# Patient Record
Sex: Female | Born: 1970 | Race: White | Hispanic: No | Smoking: Never smoker
Health system: Southern US, Community
[De-identification: ages and names within clinical notes are randomized; demographics above are authoritative.]

## PROBLEM LIST (undated history)

## (undated) DIAGNOSIS — I82409 Acute embolism and thrombosis of unspecified deep veins of unspecified lower extremity: Secondary | ICD-10-CM

## (undated) DIAGNOSIS — I10 Essential (primary) hypertension: Secondary | ICD-10-CM

## (undated) DIAGNOSIS — R51 Headache: Secondary | ICD-10-CM

## (undated) DIAGNOSIS — I2699 Other pulmonary embolism without acute cor pulmonale: Secondary | ICD-10-CM

## (undated) DIAGNOSIS — R519 Headache, unspecified: Secondary | ICD-10-CM

## (undated) HISTORY — PX: CARPAL TUNNEL RELEASE: SHX101

## (undated) HISTORY — PX: BUNIONECTOMY: SHX129

---

## 1998-10-07 ENCOUNTER — Inpatient Hospital Stay (HOSPITAL_COMMUNITY): Admission: AD | Admit: 1998-10-07 | Discharge: 1998-10-07 | Payer: Self-pay | Admitting: Obstetrics and Gynecology

## 1998-10-19 ENCOUNTER — Inpatient Hospital Stay (HOSPITAL_COMMUNITY): Admission: AD | Admit: 1998-10-19 | Discharge: 1998-10-24 | Payer: Self-pay | Admitting: Obstetrics and Gynecology

## 1998-10-28 ENCOUNTER — Encounter (HOSPITAL_COMMUNITY): Admission: RE | Admit: 1998-10-28 | Discharge: 1999-01-26 | Payer: Self-pay | Admitting: Obstetrics and Gynecology

## 1998-12-06 ENCOUNTER — Other Ambulatory Visit: Admission: RE | Admit: 1998-12-06 | Discharge: 1998-12-06 | Payer: Self-pay | Admitting: Obstetrics and Gynecology

## 1999-09-29 ENCOUNTER — Other Ambulatory Visit: Admission: RE | Admit: 1999-09-29 | Discharge: 1999-09-29 | Payer: Self-pay | Admitting: Obstetrics and Gynecology

## 2000-03-25 ENCOUNTER — Inpatient Hospital Stay (HOSPITAL_COMMUNITY): Admission: AD | Admit: 2000-03-25 | Discharge: 2000-03-25 | Payer: Self-pay | Admitting: Obstetrics and Gynecology

## 2000-04-13 ENCOUNTER — Inpatient Hospital Stay (HOSPITAL_COMMUNITY): Admission: AD | Admit: 2000-04-13 | Discharge: 2000-04-16 | Payer: Self-pay | Admitting: *Deleted

## 2000-05-25 ENCOUNTER — Other Ambulatory Visit: Admission: RE | Admit: 2000-05-25 | Discharge: 2000-05-25 | Payer: Self-pay | Admitting: *Deleted

## 2001-05-24 ENCOUNTER — Other Ambulatory Visit: Admission: RE | Admit: 2001-05-24 | Discharge: 2001-05-24 | Payer: Self-pay | Admitting: *Deleted

## 2002-06-19 ENCOUNTER — Other Ambulatory Visit: Admission: RE | Admit: 2002-06-19 | Discharge: 2002-06-19 | Payer: Self-pay | Admitting: *Deleted

## 2003-08-10 ENCOUNTER — Other Ambulatory Visit: Admission: RE | Admit: 2003-08-10 | Discharge: 2003-08-10 | Payer: Self-pay | Admitting: Obstetrics and Gynecology

## 2004-11-13 ENCOUNTER — Other Ambulatory Visit: Admission: RE | Admit: 2004-11-13 | Discharge: 2004-11-13 | Payer: Self-pay | Admitting: Obstetrics and Gynecology

## 2005-04-03 ENCOUNTER — Encounter: Admission: RE | Admit: 2005-04-03 | Discharge: 2005-04-03 | Payer: Self-pay | Admitting: Internal Medicine

## 2005-04-27 ENCOUNTER — Encounter: Admission: RE | Admit: 2005-04-27 | Discharge: 2005-04-27 | Payer: Self-pay | Admitting: *Deleted

## 2005-05-18 ENCOUNTER — Encounter: Admission: RE | Admit: 2005-05-18 | Discharge: 2005-05-18 | Payer: Self-pay | Admitting: *Deleted

## 2010-03-28 ENCOUNTER — Encounter (HOSPITAL_COMMUNITY)
Admission: RE | Admit: 2010-03-28 | Discharge: 2010-03-28 | Disposition: A | Discharge status: Home / Self Care | Payer: 59 | Source: Ambulatory Visit | Attending: Obstetrics and Gynecology | Admitting: Obstetrics and Gynecology

## 2010-03-28 DIAGNOSIS — Z01812 Encounter for preprocedural laboratory examination: Secondary | ICD-10-CM | POA: Insufficient documentation

## 2010-03-28 LAB — CBC
MCH: 29.8 pg (ref 26.0–34.0)
MCHC: 33 g/dL (ref 30.0–36.0)
MCV: 90.4 fL (ref 78.0–100.0)
RBC: 4.26 MIL/uL (ref 3.87–5.11)

## 2010-03-28 LAB — COMPREHENSIVE METABOLIC PANEL
ALT: 20 U/L (ref 0–35)
Alkaline Phosphatase: 34 U/L — ABNORMAL LOW (ref 39–117)
CO2: 25 mEq/L (ref 19–32)
Calcium: 9.4 mg/dL (ref 8.4–10.5)
Chloride: 105 mEq/L (ref 96–112)
Glucose, Bld: 95 mg/dL (ref 70–99)
Sodium: 137 mEq/L (ref 135–145)
Total Bilirubin: 0.6 mg/dL (ref 0.3–1.2)

## 2010-03-28 LAB — SURGICAL PCR SCREEN: MRSA, PCR: NEGATIVE

## 2010-04-01 ENCOUNTER — Other Ambulatory Visit: Payer: Self-pay | Admitting: Obstetrics and Gynecology

## 2010-04-01 ENCOUNTER — Ambulatory Visit (HOSPITAL_COMMUNITY)
Admission: RE | Admit: 2010-04-01 | Discharge: 2010-04-01 | Disposition: A | Discharge status: Home / Self Care | Payer: 59 | Source: Ambulatory Visit | Attending: Obstetrics and Gynecology | Admitting: Obstetrics and Gynecology

## 2010-04-01 DIAGNOSIS — IMO0002 Reserved for concepts with insufficient information to code with codable children: Secondary | ICD-10-CM | POA: Insufficient documentation

## 2010-04-01 DIAGNOSIS — N838 Other noninflammatory disorders of ovary, fallopian tube and broad ligament: Secondary | ICD-10-CM | POA: Insufficient documentation

## 2010-04-01 DIAGNOSIS — I10 Essential (primary) hypertension: Secondary | ICD-10-CM | POA: Insufficient documentation

## 2010-04-01 DIAGNOSIS — N920 Excessive and frequent menstruation with regular cycle: Secondary | ICD-10-CM | POA: Insufficient documentation

## 2010-04-01 DIAGNOSIS — E039 Hypothyroidism, unspecified: Secondary | ICD-10-CM | POA: Insufficient documentation

## 2010-04-01 DIAGNOSIS — N736 Female pelvic peritoneal adhesions (postinfective): Secondary | ICD-10-CM | POA: Insufficient documentation

## 2010-04-28 NOTE — H&P (Signed)
  [name], [name]             ACCOUNT NO.:  [id number]  MEDICAL RECORD NO.:  [id number]         PATIENT TYPE:  WAMB  LOCATION:                                FACILITY:  [facility]  PHYSICIAN:  Guy Sandifer. Henderson Cloud, M.D. DATE OF BIRTH:  1970-11-06  DATE OF ADMISSION:  04/01/2010 DATE OF DISCHARGE:                             HISTORY & PHYSICAL   CHIEF COMPLAINT:  Heavy menses and painful intercourse.  HISTORY OF PRESENT ILLNESS:  The patient is a 40 year old divorced white female who complains of steadily increasing heavy menstrual flows.  This is despite the use of NuvaRing.  She also has postcoital bleeding. Ultrasound on March 03, 2010, is consistent with uterus measuring 8.2 x 4.2 x 4.6 cm.  There was a suggestion of a density in the endometrial cavity.  However sonohysterogram could not be completed in the office due to a stenotic cervix.  The ovaries appeared normal.  After discussing options, she is admitted for laparoscopy, hysteroscopy, D and C with possible resectoscope.  Potential risks and complications have been discussed preoperatively.  PAST MEDICAL HISTORY: 1. Hypothyroidism. 2. Chronic hypertension.  PAST SURGICAL HISTORY:  Negative.  OBSTETRIC HISTORY:  Cesarean section x2.  MEDICATIONS: 1. Synthroid daily. 2. Atenolol 50 mg daily. 3. NuvaRing.  ALLERGIES:  SULFA.  SOCIAL HISTORY:  Consumes one alcoholic beverage a day.  Denies tobacco or drug abuse.  FAMILY HISTORY:  Positive for heart disease, respiratory disease, thyroid disease, arthritis, diabetes, hypertension, breast, and stomach cancer.  REVIEW OF SYSTEMS:  NEUROLOGIC:  Denies headache.  CARDIAC:  Denies chest pain.  PULMONARY:  Denies shortness of breath.  PHYSICAL EXAMINATION:  VITAL SIGNS:  Height 5 feet, 3-1/4 inches, weight 145.4 pounds, blood pressure 106/60. LUNGS:  Clear to auscultation. HEART:  Regular rate and rhythm. ABDOMEN:  Soft, nontender without masses. PELVIC:  Vulva,  vagina, and cervix without lesion.  Uterus is normal in size, mobile, nontender.  Adnexa nontender without palpable masses. EXTREMITIES:  Grossly within normal limits. NEUROLOGICAL:  Grossly within normal limits.  ASSESSMENT: 1. Postcoital bleeding. 2. Menorrhagia. 3. Dyspareunia.  PLAN:  Laparoscopy, hysteroscopy D and C with probable resectoscope.     Guy Sandifer Henderson Cloud, M.D.     JET/MEDQ  D:  03/31/2010  T:  04/01/2010  Job:  [id number]  Electronically Signed by Harold Hedge M.D. on 04/28/2010 09:28:40 AM

## 2010-04-28 NOTE — Op Note (Signed)
[name], [name]             ACCOUNT NO.:  [id number]  MEDICAL RECORD NO.:  [id number]           PATIENT TYPE:  O  LOCATION:  [facility]                          FACILITY:  [facility]  PHYSICIAN:  Guy Sandifer. Henderson Cloud, M.D. DATE OF BIRTH:  1970-07-19  DATE OF PROCEDURE:  04/01/2010 DATE OF DISCHARGE:                              OPERATIVE REPORT   PREOPERATIVE DIAGNOSES: 1. Postcoital bleeding. 2. Menorrhagia. 3. Dyspareunia.  POSTOPERATIVE DIAGNOSES: 1. Postcoital bleeding. 2. Menorrhagia. 3. Pelvic adhesions. 4. Right paratubal cyst.  PROCEDURES:  Laparoscopy with extensive lysis of adhesions, resection of right paratubal cyst, hysteroscopy, dilatation and curettage.  SURGEON:  Guy Sandifer. Henderson Cloud, MD  ANESTHESIA:  General with endotracheal intubation.  SPECIMENS: 1. Endometrial curettings. 2. Right paratubal cyst, both to pathology.  ESTIMATED BLOOD LOSS:  Minimal.  I and O's of distending media 65 mL deficit.  INDICATIONS AND CONSENT:  This patient is a 40 year old divorced white female who has steadily increasingly heavy menstrual flows.  She also has postcoital bleeding and painful intercourse.  Details are dictated in the history and physical.  After discussion of the options, she is admitted for hysteroscopy, possible resectoscope, D and C, and laparoscopy.  Potential risks and complications have been reviewed preoperatively including but limited to infection, organ damage, bleeding requiring transfusion of blood products with HIV and hepatitis acquisition, DVT, PE, pneumonia, recurred and persistent pelvic pain, painful intercourse, laparotomy, or abnormal bleeding.  All questions have been answered and consent is signed on the chart.  FINDINGS:  Upper abdomen is grossly normal.  In the pelvis, there is a slender process of the omentum adherent to the center of the anterior abdominal wall.  There is a small process of the mentum adherent to the anterior upper uterine  fundus.  The lower half of the anterior uterine fundus is adherent to the anterior abdominal wall.  Left tube and ovary are normal.  Right ovary is normal.  Right tube contains a 3-cm paratubal cyst which was translucent and drains clear fluid.  Posterior cul-de-sac is normal.  PROCEDURE:  The patient was taken to operating room.  She is identified, placed in dorsal supine position, and general anesthesia was induced via endotracheal intubation.  She was then placed in the dorsal lithotomy position.  Time-out undertaken.  She is prepped abdominally and vaginally.  Bladder was straight catheterized, and she is draped in a sterile fashion.  Bivalve speculum was placed in the vagina.  The anterior cervical lip was injected with 1% plain Xylocaine and grasped with single-tooth tenaculum.  Paracervical block was placed at 2, 4, 5, 7, and 10 o'clock positions with approximately 20 mL of the same solution.  The cervix is noted to be quite stenotic.  Lacrimal probes are required to enter the cervical canal.  Progressive dilation readily dilated the cervix.  Diagnostic hysteroscope was placed and advanced under direct visualization using distending media.  The uterine cavity was noted to be without abnormal structures.  Both ostia are noted bilaterally.  Hysteroscope was withdrawn, sharp curettage was carried out.  The good hemostasis was noted, and the single-tooth tenaculum was replaced with a Hulka  tenaculum, and attention was turned to the abdomen.  The infraumbilical and suprapubic areas were injected in the midline with about 6 mL of 0.5% plain Marcaine.  Small infraumbilical incision was made.  Disposable Veress needle was placed on the first attempt without difficulty and good syringe and drop test are noted. Two liters of gas were then insufflated under low pressure with good tympany in the right upper quadrant.  Varus needle was removed.  A 10/11 Xcel bladeless disposable trocar sleeve  was then placed using direct visualization with the diagnostic laparoscopic.  After placement, the operative laparoscope was used.  Then using the disposable Metzenbaum scissors with unipolar cautery, the adhesions of the omentum to the anterior abdominal wall were taken down under good visualization and good hemostasis.  This is well clear of any bowel.  Then, the adhesions of the uterus to the anterior abdominal wall were taken down sharply.  A small suprapubic incision was made at midline well above the bladder.  A 5-mm Xcel bladeless disposable trocar sleeve was then placed under direct visualization without difficulty.  Irrigation was carried out. Hemostasis is assured.  The remaining adhesions are taken down sharply well clear of the bladder under good visualization.  The right paratubal cyst is then enucleated.  At the end of the procedure, clear fluid was noted to drain.  The cyst was removed in its entirety and sent to pathology.  Hemostasis was maintained with minimal cautery.  Inspection under reduced pneumoperitoneum reveals good hemostasis all around. Intercede was then back loaded through the laparoscopic and is placed over the area of the uterine dissection as well as over the right fallopian tube.  A 10 mL of wall 0.5% plain Marcaine were then instilled into the peritoneum.  Suprapubic trochar sleeve was removed, pneumoperitoneum is reduced, and umbilical trochar sleeve was removed. Skin incisions are oozy with subcutaneous bleeders; therefore, both are closed with mattress type 3-0 Vicryl sutures.  Dermabond was placed on both.  Hulka tenaculum was removed, a good hemostasis was noted.  All counts correct.  The patient is awakened and taken to recovery room in stable condition.     Guy Sandifer Henderson Cloud, M.D.     JET/MEDQ  D:  04/01/2010  T:  04/01/2010  Job:  [id number]  Electronically Signed by Harold Hedge M.D. on 04/28/2010 09:28:46 AM

## 2011-05-04 ENCOUNTER — Emergency Department (HOSPITAL_BASED_OUTPATIENT_CLINIC_OR_DEPARTMENT_OTHER): Admission: EM | Admit: 2011-05-04 | Discharge: 2011-05-04 | Discharge status: Against Medical Advice | Payer: 59

## 2011-08-25 ENCOUNTER — Ambulatory Visit (INDEPENDENT_AMBULATORY_CARE_PROVIDER_SITE_OTHER): Payer: 59 | Admitting: Family Medicine

## 2011-08-25 ENCOUNTER — Ambulatory Visit: Payer: 59

## 2011-08-25 VITALS — BP 98/64 | HR 70 | Temp 98.7°F | Resp 16 | Ht 65.0 in | Wt 152.0 lb

## 2011-08-25 DIAGNOSIS — S93409A Sprain of unspecified ligament of unspecified ankle, initial encounter: Secondary | ICD-10-CM

## 2011-08-25 DIAGNOSIS — M25579 Pain in unspecified ankle and joints of unspecified foot: Secondary | ICD-10-CM

## 2011-08-25 NOTE — Progress Notes (Signed)
New Patient Visit:  HPI:  Pt was running yesterday and rolled ankle.  Has noticed significant lateral L ankle pain since this point.  Mild swelling.  Has been able to bear weight mildly.  No numbness or paresthesias.   There is no problem list on file for this patient.  Past Medical History: No past medical history on file.  Past Surgical History: No past surgical history on file.  Social History: History   Social History  . Marital Status: Divorced    Spouse Name: N/A    Number of Children: N/A  . Years of Education: N/A   Social History Main Topics  . Smoking status: Never Smoker   . Smokeless tobacco: None  . Alcohol Use: None  . Drug Use: None  . Sexually Active: None   Other Topics Concern  . None   Social History Narrative  . None    Family History: No family history on file.  Allergies: Allergies  Allergen Reactions  . Sulfa Antibiotics Hives    Current Outpatient Prescriptions  Medication Sig Dispense Refill  . atenolol (TENORMIN) 50 MG tablet Take 50 mg by mouth daily.      Marland Kitchen levothyroxine (SYNTHROID, LEVOTHROID) 150 MCG tablet Take 150 mcg by mouth daily.       Review Of Systems: negative except as noted above in HPI.    Physical Exam: Filed Vitals:   08/25/11 0903  BP: 98/64  Pulse: 70  Temp: 98.7 F (37.1 C)  Resp: 16   General: alert and cooperative HEENT: PERRLA and extra ocular movement intact Heart: S1, S2 normal, no murmur, rub or gallop, regular rate and rhythm Lungs: clear to auscultation, no wheezes or rales and unlabored breathing Abdomen: abdomen is soft without significant tenderness, masses, organomegaly or guarding Extremities: extremities normal, atraumatic, no cyanosis or edema Skin:no rashes Neurology: normal without focal findings MKS:  Ankle: No visible erythema or swelling. + pain with ankle dorsiflexion.  Strength is 5/5 in all directions. + TTP on lateral malleolus  Talar dome nontender; No pain at base  of 5th MT; No tenderness over cuboid; No tenderness over N spot or navicular prominence No sign of peroneal tendon subluxations; Negative tarsal tunnel tinel's  [facility] reading (PRIMARY) by  Dr. Alvester Morin. Xrays prelimarily negative for fracture.    Assessment and Plan: L ankle sprain.  Ankle brace.  RICE and NSAIDs.  Handout given.  Follow up in 1 week if sxs not improved in 1 week.    The patient and/or caregiver has been counseled thoroughly with regard to treatment plan and/or medications prescribed including dosage, schedule, interactions, rationale for use, and possible side effects and they verbalize understanding. Diagnoses and expected course of recovery discussed and will return if not improved as expected or if the condition worsens. Patient and/or caregiver verbalized understanding.

## 2011-08-25 NOTE — Patient Instructions (Signed)
Ankle Sprain An ankle sprain is an injury to the strong, fibrous tissues (ligaments) that hold the bones of your ankle joint together.  CAUSES Ankle sprain usually is caused by a fall or by twisting your ankle. People who participate in sports are more prone to these types of injuries.  SYMPTOMS  Symptoms of ankle sprain include:  Pain in your ankle. The pain may be present at rest or only when you are trying to stand or walk.   Swelling.   Bruising. Bruising may develop immediately or within 1 to 2 days after your injury.   Difficulty standing or walking.  DIAGNOSIS  Your caregiver will ask you details about your injury and perform a physical exam of your ankle to determine if you have an ankle sprain. During the physical exam, your caregiver will press and squeeze specific areas of your foot and ankle. Your caregiver will try to move your ankle in certain ways. An X-ray exam may be done to be sure a bone was not broken or a ligament did not separate from one of the bones in your ankle (avulsion).  TREATMENT  Certain types of braces can help stabilize your ankle. Your caregiver can make a recommendation for this. Your caregiver may recommend the use of medication for pain. If your sprain is severe, your caregiver may refer you to a surgeon who helps to restore function to parts of your skeletal system (orthopedist) or a physical therapist. HOME CARE INSTRUCTIONS  Apply ice to your injury for 1 to 2 days or as directed by your caregiver. Applying ice helps to reduce inflammation and pain.  Put ice in a plastic bag.   Place a towel between your skin and the bag.   Leave the ice on for 15 to 20 minutes at a time, every 2 hours while you are awake.   Take over-the-counter or prescription medicines for pain, discomfort, or fever only as directed by your caregiver.   Keep your injured leg elevated, when possible, to lessen swelling.   If your caregiver recommends crutches, use them as  instructed. Gradually, put weight on the affected ankle. Continue to use crutches or a cane until you can walk without feeling pain in your ankle.   If you have a plaster splint, wear the splint as directed by your caregiver. Do not rest it on anything harder than a pillow the first 24 hours. Do not put weight on it. Do not get it wet. You may take it off to take a shower or bath.   You may have been given an elastic bandage to wear around your ankle to provide support. If the elastic bandage is too tight (you have numbness or tingling in your foot or your foot becomes cold and blue), adjust the bandage to make it comfortable.   If you have an air splint, you may blow more air into it or let air out to make it more comfortable. You may take your splint off at night and before taking a shower or bath.   Wiggle your toes in the splint several times per day if you are able.  SEEK MEDICAL CARE IF:   You have an increase in bruising, swelling, or pain.   Your toes feel cold.   Pain relief is not achieved with medication.  SEEK IMMEDIATE MEDICAL CARE IF: Your toes are numb or blue or you have severe pain. MAKE SURE YOU:   Understand these instructions.   Will watch your condition.     Will get help right away if you are not doing well or get worse.  Document Released: 01/26/2005 Document Revised: 01/15/2011 Document Reviewed: 08/31/2007 [employer] Patient Information 2012 [employer]. 

## 2013-11-01 ENCOUNTER — Other Ambulatory Visit: Payer: Self-pay | Admitting: Obstetrics and Gynecology

## 2013-11-02 LAB — CYTOLOGY - PAP

## 2015-10-11 DIAGNOSIS — I2699 Other pulmonary embolism without acute cor pulmonale: Secondary | ICD-10-CM

## 2015-10-11 HISTORY — DX: Other pulmonary embolism without acute cor pulmonale: I26.99

## 2015-10-24 ENCOUNTER — Inpatient Hospital Stay (HOSPITAL_COMMUNITY)
Admission: EM | Admit: 2015-10-24 | Discharge: 2015-10-30 | DRG: 175 | Disposition: A | Discharge status: Home / Self Care | Payer: Managed Care, Other (non HMO) | Attending: Internal Medicine | Admitting: Internal Medicine

## 2015-10-24 ENCOUNTER — Other Ambulatory Visit: Payer: Self-pay

## 2015-10-24 ENCOUNTER — Emergency Department (HOSPITAL_COMMUNITY): Payer: Managed Care, Other (non HMO)

## 2015-10-24 ENCOUNTER — Encounter (HOSPITAL_COMMUNITY): Payer: Self-pay | Admitting: *Deleted

## 2015-10-24 ENCOUNTER — Encounter (HOSPITAL_COMMUNITY): Payer: Self-pay | Admitting: Emergency Medicine

## 2015-10-24 ENCOUNTER — Ambulatory Visit (INDEPENDENT_AMBULATORY_CARE_PROVIDER_SITE_OTHER)
Admission: EM | Admit: 2015-10-24 | Discharge: 2015-10-24 | Disposition: A | Discharge status: Nursing Home (Includes ICF) | Payer: Managed Care, Other (non HMO) | Source: Home / Self Care | Attending: Internal Medicine | Admitting: Internal Medicine

## 2015-10-24 DIAGNOSIS — I10 Essential (primary) hypertension: Secondary | ICD-10-CM | POA: Diagnosis not present

## 2015-10-24 DIAGNOSIS — I110 Hypertensive heart disease with heart failure: Secondary | ICD-10-CM | POA: Diagnosis present

## 2015-10-24 DIAGNOSIS — I2602 Saddle embolus of pulmonary artery with acute cor pulmonale: Principal | ICD-10-CM

## 2015-10-24 DIAGNOSIS — Z79899 Other long term (current) drug therapy: Secondary | ICD-10-CM

## 2015-10-24 DIAGNOSIS — R0781 Pleurodynia: Secondary | ICD-10-CM | POA: Diagnosis not present

## 2015-10-24 DIAGNOSIS — I2699 Other pulmonary embolism without acute cor pulmonale: Secondary | ICD-10-CM

## 2015-10-24 DIAGNOSIS — E039 Hypothyroidism, unspecified: Secondary | ICD-10-CM | POA: Diagnosis present

## 2015-10-24 DIAGNOSIS — R0609 Other forms of dyspnea: Secondary | ICD-10-CM | POA: Diagnosis not present

## 2015-10-24 DIAGNOSIS — R071 Chest pain on breathing: Secondary | ICD-10-CM | POA: Diagnosis not present

## 2015-10-24 DIAGNOSIS — I824Z2 Acute embolism and thrombosis of unspecified deep veins of left distal lower extremity: Secondary | ICD-10-CM | POA: Diagnosis present

## 2015-10-24 DIAGNOSIS — I2692 Saddle embolus of pulmonary artery without acute cor pulmonale: Secondary | ICD-10-CM | POA: Diagnosis present

## 2015-10-24 DIAGNOSIS — Z975 Presence of (intrauterine) contraceptive device: Secondary | ICD-10-CM | POA: Diagnosis not present

## 2015-10-24 DIAGNOSIS — R0600 Dyspnea, unspecified: Secondary | ICD-10-CM | POA: Diagnosis not present

## 2015-10-24 DIAGNOSIS — I82442 Acute embolism and thrombosis of left tibial vein: Secondary | ICD-10-CM | POA: Diagnosis present

## 2015-10-24 DIAGNOSIS — I82412 Acute embolism and thrombosis of left femoral vein: Secondary | ICD-10-CM | POA: Diagnosis present

## 2015-10-24 DIAGNOSIS — R262 Difficulty in walking, not elsewhere classified: Secondary | ICD-10-CM

## 2015-10-24 DIAGNOSIS — R42 Dizziness and giddiness: Secondary | ICD-10-CM | POA: Diagnosis present

## 2015-10-24 DIAGNOSIS — Z882 Allergy status to sulfonamides status: Secondary | ICD-10-CM

## 2015-10-24 DIAGNOSIS — I509 Heart failure, unspecified: Secondary | ICD-10-CM | POA: Diagnosis present

## 2015-10-24 HISTORY — DX: Headache, unspecified: R51.9

## 2015-10-24 HISTORY — DX: Headache: R51

## 2015-10-24 HISTORY — DX: Other pulmonary embolism without acute cor pulmonale: I26.99

## 2015-10-24 HISTORY — DX: Essential (primary) hypertension: I10

## 2015-10-24 LAB — I-STAT TROPONIN, ED: TROPONIN I, POC: 0.4 ng/mL — AB (ref 0.00–0.08)

## 2015-10-24 LAB — BASIC METABOLIC PANEL
Anion gap: 8 (ref 5–15)
BUN: 12 mg/dL (ref 6–20)
CALCIUM: 9.6 mg/dL (ref 8.9–10.3)
CO2: 22 mmol/L (ref 22–32)
CREATININE: 0.93 mg/dL (ref 0.44–1.00)
Chloride: 106 mmol/L (ref 101–111)
GFR calc Af Amer: >60 mL/min (ref >60)
Glucose, Bld: 138 mg/dL — ABNORMAL HIGH (ref 65–99)
POTASSIUM: 4.4 mmol/L (ref 3.5–5.1)
SODIUM: 136 mmol/L (ref 135–145)

## 2015-10-24 LAB — CBC
HCT: 41.9 % (ref 36.0–46.0)
Hemoglobin: 14 g/dL (ref 12.0–15.0)
MCH: 30.8 pg (ref 26.0–34.0)
MCHC: 33.4 g/dL (ref 30.0–36.0)
MCV: 92.1 fL (ref 78.0–100.0)
PLATELETS: 183 10*3/uL (ref 150–400)
RBC: 4.55 MIL/uL (ref 3.87–5.11)
RDW: 12.5 % (ref 11.5–15.5)
WBC: 12.2 10*3/uL — AB (ref 4.0–10.5)

## 2015-10-24 LAB — I-STAT BETA HCG BLOOD, ED (MC, WL, AP ONLY): I-stat hCG, quantitative: <5 m[IU]/mL (ref <5)

## 2015-10-24 LAB — BRAIN NATRIURETIC PEPTIDE: B NATRIURETIC PEPTIDE 5: 597.4 pg/mL — AB (ref 0.0–100.0)

## 2015-10-24 MED ORDER — IOPAMIDOL (ISOVUE-370) INJECTION 76%
INTRAVENOUS | Status: AC
  Administered 2015-10-24: 100 mL
  Filled 2015-10-24: qty 100

## 2015-10-24 MED ORDER — HEPARIN SODIUM (PORCINE) 5000 UNIT/ML IJ SOLN: 60.0000 [IU]/kg | Freq: Once | INTRAMUSCULAR | Status: DC

## 2015-10-24 MED ORDER — HEPARIN (PORCINE) IN NACL 100-0.45 UNIT/ML-% IJ SOLN
1350.0000 [IU]/h | INTRAMUSCULAR | Status: AC
  Administered 2015-10-24: 1200 [IU]/h via INTRAVENOUS
  Administered 2015-10-25: 1050 [IU]/h via INTRAVENOUS
  Administered 2015-10-26 – 2015-10-27 (×2): 1150 [IU]/h via INTRAVENOUS
  Administered 2015-10-28: 1350 [IU]/h via INTRAVENOUS
  Filled 2015-10-24 (×5): qty 250

## 2015-10-24 MED ORDER — HEPARIN BOLUS VIA INFUSION
4500.0000 [IU] | Freq: Once | INTRAVENOUS | Status: AC
  Administered 2015-10-24: 4500 [IU] via INTRAVENOUS
  Filled 2015-10-24: qty 4500

## 2015-10-24 MED ORDER — ASPIRIN 81 MG PO CHEW
324.0000 mg | CHEWABLE_TABLET | Freq: Once | ORAL | Status: AC
  Administered 2015-10-24: 324 mg via ORAL
  Filled 2015-10-24: qty 4

## 2015-10-24 NOTE — ED Provider Notes (Signed)
CSN: [id number]     Arrival date & time 10/24/15  1710 History   None    Chief Complaint  Patient presents with  . Chest Pain   (Consider location/radiation/quality/duration/timing/severity/associated sxs/prior Treatment) 45 year old female presents to the urgent care after experiencing sudden onset of DOE last PM. She states that she arose to walk to one room and suddenly experienced shortness of breath. She also experienced tightness across the anterior chest. This initially lasted approximately 15 minutes and subsided after rest. She went to bed last night and had to get up to go to the bathroom and just a short walk to the bathroom produced DOE at that time as well. She said that the symptoms have been reoccurring all day. Even at rest she feels as though she is having to breathe a little deeper and faster. The chest pain is in the center of the chest. Sometimes feels heavy and is worse when taking a deep breath. Denies pain in the back or bilateral chest. The and of the third week of August she had surgery on her foot and for 3 weeks she was nonweightbearing. She has a brace on her lower extremity.      History reviewed. No pertinent past medical history. History reviewed. No pertinent surgical history. History reviewed. No pertinent family history. Social History  Substance Use Topics  . Smoking status: Never Smoker  . Smokeless tobacco: Never Used  . Alcohol use Yes   OB History    No data available     Review of Systems  Constitutional: Positive for activity change and diaphoresis. Negative for fever.  HENT: Negative.   Eyes: Negative.   Respiratory: Positive for chest tightness and shortness of breath.   Cardiovascular: Positive for chest pain.  Gastrointestinal: Negative.   Genitourinary: Negative.   Musculoskeletal: Positive for gait problem.  Skin: Negative.   Hematological: Negative.   All other systems reviewed and are negative.   Allergies  Sulfa  antibiotics  Home Medications   Prior to Admission medications   Medication Sig Start Date End Date Taking? Authorizing Provider  atenolol (TENORMIN) 50 MG tablet Take 50 mg by mouth daily.   Yes Historical Provider, MD  levothyroxine (SYNTHROID, LEVOTHROID) 150 MCG tablet Take 150 mcg by mouth daily.    Historical Provider, MD   Meds Ordered and Administered this Visit  Medications - No data to display  BP 99/70 (BP Location: Left Arm)   Temp 99 F (37.2 C) (Oral)   Resp 20   SpO2 99%  No data found.   Physical Exam  Constitutional: She is oriented to person, place, and time. She appears well-developed and well-nourished. No distress.  HENT:  Head: Normocephalic and atraumatic.  Eyes: EOM are normal.  Neck: Normal range of motion. Neck supple.  Cardiovascular:  Borderline sinus tachycardia. Apical pulse 96 and second count was 100. Respiratory rate is 24 at rest.  Pulmonary/Chest: Effort normal and breath sounds normal. She has no wheezes. She has no rales. She exhibits tenderness.  Tenderness along the bilateral parasternal chest wall.  Speaking in complete sentences without having to stop to breathe.  Musculoskeletal: Normal range of motion. She exhibits no edema.  Lymphadenopathy:    She has no cervical adenopathy.  Neurological: She is alert and oriented to person, place, and time.  Skin: Skin is warm and dry. Capillary refill takes less than 2 seconds. No rash noted.  Psychiatric: She has a normal mood and affect.  Nursing note and vitals reviewed.  Urgent Care Course   Clinical Course  ED ECG REPORT   Date: 10/24/2015  Rate: 94  Rhythm: normal sinus rhythm  QRS Axis: rightward  Intervals: normal  ST/T Wave abnormalities: inverted T waves V2,V3  Conduction Disutrbances:none  Narrative Interpretation:   Old EKG Reviewed: none available  I have personally reviewed the EKG tracing and agree with the computerized printout as noted.   Procedures (including  critical care time)  Labs Review Labs Reviewed - No data to display  Imaging Review No results found.   Visual Acuity Review  Right Eye Distance:   Left Eye Distance:   Bilateral Distance:    Right Eye Near:   Left Eye Near:    Bilateral Near:         MDM   1. Chest pain on breathing   2. DOE (dyspnea on exertion)   3. Dyspnea    Patient with sudden onset of DOE, borderline tachycardia and mild dyspnea at rest for approximately 20 hours. Currently stable. Pulse rate 96 to 100. Lungs are clear. EKG unremarkable for ischemic changes. Transfer via shuttle. Concern for possible PE.    Hayden Rasmussen, NP 10/24/15 [phone]

## 2015-10-24 NOTE — H&P (Signed)
History and Physical  Patient Name: [name]     JWJ:[id number]    DOB: 1971-01-04    DOA: 10/24/2015 PCP: Katy Apo, MD   Patient coming from: Home  Chief Complaint: Chest pain  HPI: [name] is a 44 y.o. female with a past medical history significant for hypothyroidism and HTN who presents with chest pain and SOB.  He patient was in her usual state of health until last night when she developed sharp substernal chest pain, worse with exertion, worse with inspiration as well as difficulty catching her breath. She does a panic attack, went to bed, but today was still there is a she went to urgent care. At urgent care she was sent urgently to the emergency room.  ED course: -Afebrile, heart rate 90s, respirations 16, blood pressure 120/100,  pulse oximetry on room air -Na 136, K 4.34, Cr 0.9, WBC 12.2K, Hgb 14 -BNP 597 pg/mL, troponin istat 0.4 -CT angiogram of the chest showed a bilateral PE with thin saddle, RV to LV ratio 2, submassive, high risk -CCM were called, and evaluated the patient who was hemodynamically stable and placed on heparin gtt  No previous history of PE.  Of note, she had a bunionectomy on her left foot in July, has been in a boot since then, noting some intermittent mild left foot swelling from time to time, which resolves with elevating the leg. She is in no recent car trips. She's had no recent weight loss, no known cancer.  No family history of DVT or PE.  She uses a NuvaRing, but takes no exogenous estrogen.     ROS: Review of Systems  Constitutional: Negative for weight loss.  Respiratory: Positive for shortness of breath. Negative for hemoptysis.   Cardiovascular: Positive for chest pain and leg swelling (mild left leg, intermittent weeks).  Neurological: Negative for loss of consciousness.  All other systems reviewed and are negative.         Past Medical History:  Diagnosis Date  . Hypertension   . Thyroid disease    hypothyroidism    Past Surgical History:  Procedure Laterality Date  . BUNIONECTOMY      Social History: Patient lives with her two sons, 67 and 65.  The patient walks unassisted.  She works in Armed forces operational officer. Is from [place].  Does not smoke.    Allergies  Allergen Reactions  . Sulfa Antibiotics Hives    Family history: family history includes Hypertension in her father; Hypothyroidism in her father.  Prior to Admission medications   Medication Sig Start Date End Date Taking? Authorizing Provider  atenolol (TENORMIN) 50 MG tablet Take 50 mg by mouth daily.   Yes Historical Provider, MD  levothyroxine (SYNTHROID, LEVOTHROID) 175 MCG tablet Take 175 mcg by mouth daily before breakfast.  08/15/15  Yes Historical Provider, MD  NUVARING 0.12-0.015 MG/24HR vaginal ring Place 1 each vaginally every 28 (twenty-eight) days.  10/11/15  Yes Historical Provider, MD  SUMAtriptan (IMITREX) 25 MG tablet Take 25 mg by mouth every 2 (two) hours as needed for migraine.  09/21/15  Yes Historical Provider, MD       Physical Exam: BP 138/93   Pulse 88   Resp 20   Ht 5\' 4"  (1.626 m)   Wt 76.7 kg (169 lb 1.5 oz)   LMP 10/17/2015   SpO2 99%   BMI 29.02 kg/m  General appearance: Well-developed, adult female, alert and in no acute distress.   Eyes: Anicteric, conjunctiva pink,  lids and lashes normal. PERRL.    ENT: No nasal deformity, discharge, epistaxis.  Hearing normal. OP moist without lesions.   Neck: No neck masses.  Trachea midline.  No thyromegaly/tenderness. Lymph: No cervical or supraclavicular lymphadenopathy. Skin: Warm and dry.  No jaundice.  No suspicious rashes or lesions. Cardiac: RRR, nl S1-S2, no murmurs appreciated.  Capillary refill is brisk.  JVP normal.  No LE edema.  Radial and DP pulses 2+ and symmetric. Respiratory: Normal respiratory rate and rhythm.  CTAB without rales or wheezes. Abdomen: Abdomen soft.  No TTP. No ascites, distension, hepatosplenomegaly.   MSK: No  deformities or effusions.  No cyanosis or clubbing. Neuro: Cranial nerves grossly normal.  Muscle strength normal.    Psych: Sensorium intact and responding to questions, attention normal.  Behavior appropriate.  Affect normal.  Judgment and insight appear normal.     Labs on Admission:  I have personally reviewed following labs and imaging studies: CBC:  Recent Labs Lab 10/24/15 1856  WBC 12.2*  HGB 14.0  HCT 41.9  MCV 92.1  PLT 183   Basic Metabolic Panel:  Recent Labs Lab 10/24/15 1856  NA 136  K 4.4  CL 106  CO2 22  GLUCOSE 138*  BUN 12  CREATININE 0.93  CALCIUM 9.6   GFR: Estimated Creatinine Clearance: 75.8 mL/min (by C-G formula based on SCr of 0.93 mg/dL).          Radiological Exams on Admission: Personally reviewed chest x-ray shows no focal opacity.  Reports below were reviewed: Dg Chest 2 View  Result Date: 10/24/2015 CLINICAL DATA:  Chest pain EXAM: CHEST  2 VIEW COMPARISON:  None. FINDINGS: The heart size and mediastinal contours are within normal limits. Both lungs are clear. The visualized skeletal structures are unremarkable. IMPRESSION: No active cardiopulmonary disease. Electronically Signed   By: Bary RichardStan  Maynard M.D.   On: 10/24/2015 19:34   Ct Angio Chest Pe W And/or Wo Contrast  Result Date: 10/24/2015 CLINICAL DATA:  Chest pain and shortness of breath since yesterday. EXAM: CT ANGIOGRAPHY CHEST WITH CONTRAST TECHNIQUE: Multidetector CT imaging of the chest was performed using the standard protocol during bolus administration of intravenous contrast. Multiplanar CT image reconstructions and MIPs were obtained to evaluate the vascular anatomy. CONTRAST:  100 cc Isovue 370 IV COMPARISON:  Chest radiograph earlier this date FINDINGS: Cardiovascular: Positive for acute pulmonary embolus with thin saddle embolus and clot extending into both main pulmonary arteries, lobar and segmental branches. Clot burden is moderate to large. The RV to LV ratio is 2.0  consistent with right heart strain. There is straightening of the intraventricular septum and contrast refluxing into the hepatic veins. Normal caliber thoracic aorta. Mediastinum/Nodes: No enlarged mediastinal, hilar, or axillary lymph nodes. Thyroid gland, trachea, and esophagus demonstrate no significant findings. Lungs/Pleura: No pulmonary infarct. Lungs are clear. No pleural fluid. Upper Abdomen: No acute abnormality. Musculoskeletal: No chest wall abnormality. No acute or significant osseous findings. Review of the MIP images confirms the above findings. IMPRESSION: Positive for acute PE with CT evidence of right heart strain (RV/LV Ratio = 2.0) consistent with at least submassive (intermediate risk) PE. The presence of right heart strain has been associated with an increased risk of morbidity and mortality. Please activate Code PE by paging [phone]. Moderate to large clot burden with thin saddle component. Critical Value/emergent results were called by telephone at the time of interpretation on 10/24/2015 at 9:45 pm to Dr. Caren GriffinsADAM LUCKEY , who verbally acknowledged these results. Electronically  Signed   By: Rubye Oaks M.D.   On: 10/24/2015 21:46    EKG: Independently reviewed. Rate 94, QTc 452, Right axis, no ST depressions.    Assessment/Plan Principal Problem:   Acute saddle pulmonary embolism with acute cor pulmonale (HCC) Active Problems:   Essential hypertension   Hypothyroidism, acquired  1. Acute saddle PE, submassive, some heart strain, first unprovoked:  -Critical care consulted, appreciate recommendations -Agree with echocardiogram and lower extremity ultrasounds, ordered -Continue heparin drip, dosed per pharmacy -Consult to CM to verify NOAC coverage with insurance -Discussed with PCCM, who will consult IR pending echo results tomorrow morning    2. Hypothyroidism:  -Continue levothyroxine  3. HTN:  -Hold atenolol until hemodynamics clearer      DVT  prophylaxis: N/A  Code Status: FULL  Family Communication: Mother and friend at bedside.  Discussed overnight plan, risks of bleeding, plans for anticoagulation going forward (6 months, and then either stop or continue in discussion with Pulm or PCP)  Disposition Plan: Anticipate heparin overnight.  Echo in AM.  Consult to IR for destabilization or high risk Echo/LE US findings. Consults called: PCCM Admission status: INPATIENT, stepdown       Medical decision making: Patient seen at 11:00 PM on 10/24/2015.  The patient was discussed with Dr. Ruthell Rummage and Joneen Roach.  What exists of the patient's chart was reviewed in depth and summarized above.  Clinical condition: curently stable requiring only supplemental O2 and in no respiratory distress.        Alberteen Sam [facility] Pager [phone]      At the time of admission, it appears that the appropriate admission status for this patient is INPATIENT. This is judged to be reasonable and necessary in order to provide the required intensity of service to ensure the patient's safety given the presenting symptoms, physical exam findings, and initial radiographic and laboratory data in the context of their chronic comorbidities.  Together, these circumstances are felt to place her/him at high risk for further clinical deterioration threatening life, limb, or organ. The following factors support the admission status of inpatient:   A. The patient's presenting symptoms include chest pain and shortness of breath. B. The worrisome physical exam findings include tachycardia C. The initial radiographic and laboratory data are worrisome because of saddle PE with right heart strain, elevated troponin, elevated BNP. D. The chronic co-morbidities include hypothyroidism and hypertension. E. Patient requires inpatient status due to high intensity of service, high risk for further deterioration and high frequency of surveillance required  because of this acute illness that poses a threat to life or bodily function. F. I certify that at the point of admission it is my clinical judgment that the patient will require inpatient hospital care spanning beyond 2 midnights from the point of admission.

## 2015-10-24 NOTE — ED Triage Notes (Signed)
Pt here for chest pain, sob and dizziness when she ambulates starting at 0930 last night.  Foot surgery 7/24.

## 2015-10-24 NOTE — ED Notes (Signed)
MD schlossman made aware trop .40 also RN [employer] Goss. Pt taken to treatment room and placed on cardiac monitor.

## 2015-10-24 NOTE — Consult Note (Signed)
Name: [name] MRN: [id number] DOB: 04-Nov-1970    ADMISSION DATE:  10/24/2015 CONSULTATION DATE:  9/14  REFERRING MD :  Dr. Dalene Seltzer EDP  CHIEF COMPLAINT:  CP/SOB  HISTORY OF PRESENT ILLNESS:  45 year old female with PMH of hypothyroidism and hypertension who underwent surgery on her L foot for bone spur 7/24. Leg was immobilized and she was non-weight bearing for 3 weeks post-operatively. She is a never smoker and is on birth control in the form of a NuVa Ring. Post operative course uncomplicated until 9/13 when she developed CP and SOB worse with activity.This persisted x1 day when she reported to [facility] ED 9/14 with these complaints. CT angiogram consistent with saddle PE with RV/LV ratio 2.0. PCCM asked to see for further evaluation.   SIGNIFICANT EVENTS  7/24 L foot bone spur surgery > immobile for 3 weeks pos op.  9/14 admit for submassive PE  STUDIES:  CT angio 9/14 > Positive for acute PE with CT evidence of right heart strain (RV/LV Ratio = 2.0) consistent with at least submassive (intermediate risk) PE. Moderate to large clot burden with thin saddle component.  PAST MEDICAL HISTORY :   has a past medical history of Hypertension and Thyroid disease.  has a past surgical history that includes Bunionectomy. Prior to Admission medications   Medication Sig Start Date End Date Taking? Authorizing Provider  atenolol (TENORMIN) 50 MG tablet Take 50 mg by mouth daily.   Yes Historical Provider, MD  levothyroxine (SYNTHROID, LEVOTHROID) 175 MCG tablet Take 175 mcg by mouth daily before breakfast.  08/15/15  Yes Historical Provider, MD  NUVARING 0.12-0.015 MG/24HR vaginal ring Place 1 each vaginally every 28 (twenty-eight) days.  10/11/15  Yes Historical Provider, MD  SUMAtriptan (IMITREX) 25 MG tablet Take 25 mg by mouth every 2 (two) hours as needed for migraine.  09/21/15  Yes Historical Provider, MD   Allergies  Allergen Reactions  . Sulfa Antibiotics Hives    FAMILY HISTORY:   family history is not on file. SOCIAL HISTORY:  reports that she has never smoked. She has never used smokeless tobacco. She reports that she drinks alcohol. She reports that she does not use drugs.  REVIEW OF SYSTEMS:   Bolds are positive  Constitutional: weight loss, gain, night sweats, Fevers, chills, fatigue .  HEENT: headaches, Sore throat, sneezing, nasal congestion, post nasal drip, Difficulty swallowing, Tooth/dental problems, visual complaints visual changes, ear ache CV:  chest pain, radiates: ,Orthopnea, PND, swelling in lower extremities, dizziness, palpitations, syncope.  GI  heartburn, indigestion, abdominal pain, nausea, vomiting, diarrhea, change in bowel habits, loss of appetite, bloody stools.  Resp: cough, productive: , hemoptysis, dyspnea, chest pain, pleuritic.  Skin: rash or itching or icterus GU: dysuria, change in color of urine, urgency or frequency. flank pain, hematuria  MS: joint pain or swelling. decreased range of motion  Psych: change in mood or affect. depression or anxiety.  Neuro: difficulty with speech, weakness, numbness, ataxia    SUBJECTIVE:   VITAL SIGNS: Temp:  [99 F (37.2 C)] 99 F (37.2 C) (09/14 1733) Pulse Rate:  [84-90] 90 (09/14 2204) Resp:  [16-20] 20 (09/14 2204) BP: (99-125)/(70-98) 125/98 (09/14 2204) SpO2:  [99 %-100 %] 99 % (09/14 2204)  PHYSICAL EXAMINATION: General:  Female of normal body habitus in NAD Neuro:  Alert, oriented, non-focal HEENT:  [place]/AT, PERRL, no JVD Cardiovascular:  RRR, no MRG Lungs:  Clear bilateral breath sounds Abdomen:  Soft, non-tender, non-distended Musculoskeletal:  No acute  deformity or ROM limitation.  NO edema, erythema, warmth to either lower extremity Skin:  Grossly intact   Recent Labs Lab 10/24/15 1856  NA 136  K 4.4  CL 106  CO2 22  BUN 12  CREATININE 0.93  GLUCOSE 138*    Recent Labs Lab 10/24/15 1856  HGB 14.0  HCT 41.9  WBC 12.2*  PLT 183   Dg Chest 2 View  Result  Date: 10/24/2015 CLINICAL DATA:  Chest pain EXAM: CHEST  2 VIEW COMPARISON:  None. FINDINGS: The heart size and mediastinal contours are within normal limits. Both lungs are clear. The visualized skeletal structures are unremarkable. IMPRESSION: No active cardiopulmonary disease. Electronically Signed   By: Bary RichardStan  Maynard M.D.   On: 10/24/2015 19:34   Ct Angio Chest Pe W And/or Wo Contrast  Result Date: 10/24/2015 CLINICAL DATA:  Chest pain and shortness of breath since yesterday. EXAM: CT ANGIOGRAPHY CHEST WITH CONTRAST TECHNIQUE: Multidetector CT imaging of the chest was performed using the standard protocol during bolus administration of intravenous contrast. Multiplanar CT image reconstructions and MIPs were obtained to evaluate the vascular anatomy. CONTRAST:  100 cc Isovue 370 IV COMPARISON:  Chest radiograph earlier this date FINDINGS: Cardiovascular: Positive for acute pulmonary embolus with thin saddle embolus and clot extending into both main pulmonary arteries, lobar and segmental branches. Clot burden is moderate to large. The RV to LV ratio is 2.0 consistent with right heart strain. There is straightening of the intraventricular septum and contrast refluxing into the hepatic veins. Normal caliber thoracic aorta. Mediastinum/Nodes: No enlarged mediastinal, hilar, or axillary lymph nodes. Thyroid gland, trachea, and esophagus demonstrate no significant findings. Lungs/Pleura: No pulmonary infarct. Lungs are clear. No pleural fluid. Upper Abdomen: No acute abnormality. Musculoskeletal: No chest wall abnormality. No acute or significant osseous findings. Review of the MIP images confirms the above findings. IMPRESSION: Positive for acute PE with CT evidence of right heart strain (RV/LV Ratio = 2.0) consistent with at least submassive (intermediate risk) PE. The presence of right heart strain has been associated with an increased risk of morbidity and mortality. Please activate Code PE by paging  [phone]. Moderate to large clot burden with thin saddle component. Critical Value/emergent results were called by telephone at the time of interpretation on 10/24/2015 at 9:45 pm to Dr. Caren GriffinsADAM LUCKEY , who verbally acknowledged these results. Electronically Signed   By: Rubye OaksMelanie  Ehinger M.D.   On: 10/24/2015 21:46    ASSESSMENT / PLAN:  Saddle pulmonary embolism: Her history is classic for PE. She had surgery in July and was immobile and non-weight bearing for 3 weeks afterwards so safe to say this is provoked PE. She is also on contraceptive. RV/LV ratio 2 on CT so concern for some significant R heart strain. She is hemodynamically stable and sats are 100% on room air. No distress.   - Start heparin infusion per pharmacy.  - Echo, dopplers - Should consider EKOS directed lysis, meets criteria. Reasonable to see Echo first - Supplemental O2 as needed to keep SpO2 > 95% - If mobile DVT on dopplers will consider filter with her current clot burden.    Joneen RoachPaul Deshara Rossi, AGACNP-BC [facility] Pager [phone] or [phone]  10/24/2015 10:32 PM

## 2015-10-24 NOTE — ED Provider Notes (Signed)
[facility]-EMERGENCY DEPT Provider Note   CSN: [id number] Arrival date & time: 10/24/15  1845     History   Chief Complaint Chief Complaint  Patient presents with  . Chest Pain  . Shortness of Breath    HPI [name] is a 45 y.o. female.  HPI  Patient is a 45 year old female with a past medical history of hypertension hypothyroidism comes in today complaining of chest pain.  Patient states the pain began suddenly last night was accompanied by shortness of breath.  Patient denies pain in the left arm or the neck.  Patient denies nausea or vomiting.  Patient states pain is made worse by taking a deep breath and by pushing on her chest wall.  Patient states shortness of breath is made worse by walking and relieved by rest.  Of note patient had surgical repair of the bone spur of the left foot on 24 July.  Patient was placed in the boot and made nonweightbearing for 3 weeks.  Patient states she does take birth control. Past Medical History:  Diagnosis Date  . Hypertension   . Thyroid disease    hypothyroidism    Patient Active Problem List   Diagnosis Date Noted  . Acute saddle pulmonary embolism with acute cor pulmonale (HCC) 10/24/2015  . Essential hypertension 10/24/2015  . Hypothyroidism, acquired 10/24/2015    Past Surgical History:  Procedure Laterality Date  . BUNIONECTOMY      OB History    No data available       Home Medications    Prior to Admission medications   Medication Sig Start Date End Date Taking? Authorizing Provider  atenolol (TENORMIN) 50 MG tablet Take 50 mg by mouth daily.   Yes Historical Provider, MD  levothyroxine (SYNTHROID, LEVOTHROID) 175 MCG tablet Take 175 mcg by mouth daily before breakfast.  08/15/15  Yes Historical Provider, MD  NUVARING 0.12-0.015 MG/24HR vaginal ring Place 1 each vaginally every 28 (twenty-eight) days.  10/11/15  Yes Historical Provider, MD  SUMAtriptan (IMITREX) 25 MG tablet Take 25 mg by mouth every 2 (two) hours  as needed for migraine.  09/21/15  Yes Historical Provider, MD    Family History Family History  Problem Relation Age of Onset  . Hypertension Father   . Hypothyroidism Father   . Pulmonary embolism Neg Hx     Social History Social History  Substance Use Topics  . Smoking status: Never Smoker  . Smokeless tobacco: Never Used  . Alcohol use Yes     Allergies   Sulfa antibiotics   Review of Systems Review of Systems  Respiratory: Positive for chest tightness and shortness of breath.   Cardiovascular: Positive for chest pain. Negative for palpitations and leg swelling.  Gastrointestinal: Negative for abdominal pain.  All other systems reviewed and are negative.    Physical Exam Updated Vital Signs BP 107/88   Pulse 87   Resp 16   Ht 5\' 4"  (1.626 m)   Wt 76.7 kg   LMP 10/17/2015   SpO2 100%   BMI 29.02 kg/m   Physical Exam  Constitutional: She appears well-developed and well-nourished. No distress.  HENT:  Head: Normocephalic and atraumatic.  Eyes: Conjunctivae are normal.  Neck: Neck supple.  Cardiovascular: Normal rate and regular rhythm.   No murmur heard. Pulmonary/Chest: Effort normal and breath sounds normal. No respiratory distress.  Abdominal: Soft. There is no tenderness.  Musculoskeletal: She exhibits no edema.  Neurological: She is alert.  Skin: Skin is warm  and dry.  Psychiatric: She has a normal mood and affect.  Nursing note and vitals reviewed.    ED Treatments / Results  Labs (all labs ordered are listed, but only abnormal results are displayed) Labs Reviewed  BASIC METABOLIC PANEL - Abnormal; Notable for the following:       Result Value   Glucose, Bld 138 (*)    All other components within normal limits  CBC - Abnormal; Notable for the following:    WBC 12.2 (*)    All other components within normal limits  BRAIN NATRIURETIC PEPTIDE - Abnormal; Notable for the following:    B Natriuretic Peptide 597.4 (*)    All other components  within normal limits  I-STAT TROPOININ, ED - Abnormal; Notable for the following:    Troponin i, poc 0.40 (*)    All other components within normal limits  HEPARIN LEVEL (UNFRACTIONATED)  CBC  I-STAT BETA HCG BLOOD, ED ([facility], [facility], [facility] ONLY)    EKG  EKG Interpretation  Date/Time:  Thursday October 24 2015 18:53:18 EDT Ventricular Rate:  94 PR Interval:  150 QRS Duration: 74 QT Interval:  362 QTC Calculation: 452 R Axis:   125 Text Interpretation:  Normal sinus rhythm Right axis deviation Abnormal ECG No significant change since last tracing Confirmed by [facility]ston[facility]LOS[facility]AN MD, ERIN ([id number]) on 10/24/2015 8:01:34 PM       Radiology Dg Chest 2 View  Result Date: 10/24/2015 CLINICAL DATA:  Chest pain EXAM: CHEST  2 VIEW COMPARISON:  None. FINDINGS: The heart size and mediastinal contours are within normal limits. Both lungs are clear. The visualized skeletal structures are unremarkable. IMPRESSION: No active cardiopulmonary disease. Electronically Signed   By: Bary RichardStan  Maynard M.D.   On: 10/24/2015 19:34   Ct Angio Chest Pe W And/or Wo Contrast  Result Date: 10/24/2015 CLINICAL DATA:  Chest pain and shortness of breath since yesterday. EXAM: CT ANGIOGRAPHY CHEST WITH CONTRAST TECHNIQUE: Multidetector CT imaging of the chest was performed using the standard protocol during bolus administration of intravenous contrast. Multiplanar CT image reconstructions and MIPs were obtained to evaluate the vascular anatomy. CONTRAST:  100 cc Isovue 370 IV COMPARISON:  Chest radiograph earlier this date FINDINGS: Cardiovascular: Positive for acute pulmonary embolus with thin saddle embolus and clot extending into both main pulmonary arteries, lobar and segmental branches. Clot burden is moderate to large. The RV to LV ratio is 2.0 consistent with right heart strain. There is straightening of the intraventricular septum and contrast refluxing into the hepatic veins. Normal caliber thoracic aorta. Mediastinum/Nodes: No  enlarged mediastinal, hilar, or axillary lymph nodes. Thyroid gland, trachea, and esophagus demonstrate no significant findings. Lungs/Pleura: No pulmonary infarct. Lungs are clear. No pleural fluid. Upper Abdomen: No acute abnormality. Musculoskeletal: No chest wall abnormality. No acute or significant osseous findings. Review of the MIP images confirms the above findings. IMPRESSION: Positive for acute PE with CT evidence of right heart strain (RV/LV Ratio = 2.0) consistent with at least submassive (intermediate risk) PE. The presence of right heart strain has been associated with an increased risk of morbidity and mortality. Please activate Code PE by paging [phone]. Moderate to large clot burden with thin saddle component. Critical Value/emergent results were called by telephone at the time of interpretation on 10/24/2015 at 9:45 pm to Dr. Caren GriffinsADAM Mirielle Byrum , who verbally acknowledged these results. Electronically Signed   By: Rubye OaksMelanie  Ehinger M.D.   On: 10/24/2015 21:46    Procedures Procedures (including critical care time)  Medications Ordered  in ED Medications  heparin ADULT infusion 100 units/mL (25000 units/275mL sodium chloride 0.45%) (1,200 Units/hr Intravenous New Bag/Given 10/24/15 2238)  aspirin chewable tablet 324 mg (324 mg Oral Given 10/24/15 2010)  iopamidol (ISOVUE-370) 76 % injection (100 mLs  Contrast Given 10/24/15 2035)  heparin bolus via infusion 4,500 Units (4,500 Units Intravenous Bolus from Bag 10/24/15 2239)     Initial Impression / Assessment and Plan / ED Course  I have reviewed the triage vital signs and the nursing notes.  Pertinent labs & imaging results that were available during my care of the patient were reviewed by me and considered in my medical decision making (see chart for details).  Clinical Course    Patient is a 45 year old female with a past medical history of hypertension hypothyroidism comes in today complaining of chest pain.  Patient states the pain  began suddenly last night was accompanied by shortness of breath.  Patient denies pain in the left arm or the neck.  Patient denies nausea or vomiting.  Patient states pain is made worse by taking a deep breath and by pushing on her chest wall.  Patient states shortness of breath is made worse by walking and relieved by rest.  Of note patient had surgical repair of the bone spur of the left foot on 24 July.  Patient was placed in the boot and made nonweightbearing for 3 weeks.  Patient states she does take birth control.  Physical exam patient clear to auscultation bilaterally with normal S1-S2 no rubs murmurs gallops abdomen soft nontender.  Remainder physical exam within normal limits.  No notable swelling of the lower extremities.  There is a surgical scar over the MCP of the left great toe.  Triage troponin showed a level of 0.4.  Given  patient's history and physical, believe pulmonary embolus to be likely.  Will collect CTA of the chest.  CT showed moderate size saddle embolus. Plum crit care consulted, code PE called. Heparin started.  Pt admitted to hospitalist w/ pulm crit care following.    Final Clinical Impressions(s) / ED Diagnoses   Final diagnoses:  None    New Prescriptions New Prescriptions   No medications on file     Caren Griffins, MD 10/25/15 1610    Alvira Monday, MD 10/27/15 2106

## 2015-10-24 NOTE — Progress Notes (Signed)
ANTICOAGULATION CONSULT NOTE - Initial Consult  Pharmacy Consult for heparin Indication: pulmonary embolus  Allergies  Allergen Reactions  . Sulfa Antibiotics Hives    Patient Measurements: Height: 5\' 4"  (162.6 cm) Weight: 169 lb 1.5 oz (76.7 kg) IBW/kg (Calculated) : 54.7 Heparin Dosing Weight: 71 kg  Vital Signs: Temp: 99 F (37.2 C) (09/14 1733) Temp Source: Oral (09/14 1733) BP: 125/98 (09/14 2204) Pulse Rate: 90 (09/14 2204)  Labs:  Recent Labs  10/24/15 1856  HGB 14.0  HCT 41.9  PLT 183  CREATININE 0.93    Estimated Creatinine Clearance: 75.8 mL/min (by C-G formula based on SCr of 0.93 mg/dL).   Medical History: Past Medical History:  Diagnosis Date  . Hypertension   . Thyroid disease    hypothyroidism    Assessment: 45 yo female admitted with chest pain and shortness of breath. CT positive for PE with right heart strain. Also suggestive of thin saddle component per report. CBC stable. No anticoagulation prior to admission. No signs or symptoms of bleeding noted.   Goal of Therapy:  Heparin level 0.3-0.7 units/ml Monitor platelets by anticoagulation protocol: Yes   Plan:  Give 4500 units bolus x 1 Start heparin infusion at 1200 units/hr Check anti-Xa level in 6 hours and daily while on heparin Continue to monitor H&H and platelets  York CeriseKatherine Cook, PharmD Pharmacy Resident  Pager [phone] 10/24/15 10:42 PM

## 2015-10-24 NOTE — ED Notes (Signed)
Pt to ct 

## 2015-10-24 NOTE — Discharge Instructions (Signed)
Patient discharged to the emergency department via shuttle.

## 2015-10-24 NOTE — ED Triage Notes (Signed)
Pt c/o constant CP onset yest night associated w/SOB  Reports pain increases w/activity and deep breathing... Also states "it feels like someone is standing on my chest"  Denies: HA, blurred vision, weakness  Hx of HTN  A&O x4... Talking in complete sentences... NAD

## 2015-10-25 ENCOUNTER — Encounter (HOSPITAL_COMMUNITY): Payer: Self-pay | Admitting: General Practice

## 2015-10-25 ENCOUNTER — Inpatient Hospital Stay (HOSPITAL_COMMUNITY): Payer: Managed Care, Other (non HMO)

## 2015-10-25 DIAGNOSIS — I2699 Other pulmonary embolism without acute cor pulmonale: Secondary | ICD-10-CM

## 2015-10-25 LAB — HEPARIN LEVEL (UNFRACTIONATED)
HEPARIN UNFRACTIONATED: 0.41 [IU]/mL (ref 0.30–0.70)
Heparin Unfractionated: 0.76 IU/mL — ABNORMAL HIGH (ref 0.30–0.70)
Heparin Unfractionated: 0.3 IU/mL (ref 0.30–0.70)

## 2015-10-25 LAB — COMPREHENSIVE METABOLIC PANEL
ALT: 19 U/L (ref 14–54)
AST: 22 U/L (ref 15–41)
Albumin: 3.4 g/dL — ABNORMAL LOW (ref 3.5–5.0)
Alkaline Phosphatase: 44 U/L (ref 38–126)
Anion gap: 9 (ref 5–15)
BUN: 10 mg/dL (ref 6–20)
CHLORIDE: 108 mmol/L (ref 101–111)
CO2: 20 mmol/L — ABNORMAL LOW (ref 22–32)
CREATININE: 0.83 mg/dL (ref 0.44–1.00)
Calcium: 9.3 mg/dL (ref 8.9–10.3)
Glucose, Bld: 103 mg/dL — ABNORMAL HIGH (ref 65–99)
POTASSIUM: 3.9 mmol/L (ref 3.5–5.1)
Sodium: 137 mmol/L (ref 135–145)
TOTAL PROTEIN: 6.7 g/dL (ref 6.5–8.1)
Total Bilirubin: 0.8 mg/dL (ref 0.3–1.2)

## 2015-10-25 LAB — CBC
HCT: 39.4 % (ref 36.0–46.0)
Hemoglobin: 13.3 g/dL (ref 12.0–15.0)
MCH: 30.8 pg (ref 26.0–34.0)
MCHC: 33.8 g/dL (ref 30.0–36.0)
MCV: 91.2 fL (ref 78.0–100.0)
PLATELETS: 166 10*3/uL (ref 150–400)
RBC: 4.32 MIL/uL (ref 3.87–5.11)
RDW: 12.5 % (ref 11.5–15.5)
WBC: 13.4 10*3/uL — ABNORMAL HIGH (ref 4.0–10.5)

## 2015-10-25 LAB — TROPONIN I: TROPONIN I: 0.31 ng/mL — AB (ref <0.03)

## 2015-10-25 LAB — MRSA PCR SCREENING: MRSA BY PCR: NEGATIVE

## 2015-10-25 MED ORDER — IBUPROFEN 200 MG PO TABS: 600.0000 mg | ORAL_TABLET | Freq: Four times a day (QID) | ORAL | Status: DC | PRN

## 2015-10-25 MED ORDER — ONDANSETRON HCL 4 MG PO TABS: 4.0000 mg | ORAL_TABLET | Freq: Four times a day (QID) | ORAL | Status: DC | PRN

## 2015-10-25 MED ORDER — ATENOLOL 12.5 MG HALF TABLET
12.5000 mg | ORAL_TABLET | Freq: Every day | ORAL | Status: DC
Start: 2015-10-25 — End: 2015-10-26
  Administered 2015-10-25 – 2015-10-26 (×2): 12.5 mg via ORAL
  Filled 2015-10-25 (×2): qty 1

## 2015-10-25 MED ORDER — SENNOSIDES-DOCUSATE SODIUM 8.6-50 MG PO TABS
1.0000 | ORAL_TABLET | Freq: Every evening | ORAL | Status: DC | PRN
  Filled 2015-10-25: qty 1

## 2015-10-25 MED ORDER — ACETAMINOPHEN 650 MG RE SUPP: 650.0000 mg | Freq: Four times a day (QID) | RECTAL | Status: DC | PRN

## 2015-10-25 MED ORDER — MORPHINE SULFATE (PF) 4 MG/ML IV SOLN: 4.0000 mg | INTRAVENOUS | Status: DC | PRN

## 2015-10-25 MED ORDER — ACETAMINOPHEN 325 MG PO TABS: 650.0000 mg | ORAL_TABLET | Freq: Four times a day (QID) | ORAL | Status: DC | PRN

## 2015-10-25 MED ORDER — ONDANSETRON HCL 4 MG/2ML IJ SOLN: 4.0000 mg | Freq: Four times a day (QID) | INTRAMUSCULAR | Status: DC | PRN

## 2015-10-25 MED ORDER — PERFLUTREN LIPID MICROSPHERE
1.0000 mL | INTRAVENOUS | Status: AC | PRN
  Administered 2015-10-25: 2 mL via INTRAVENOUS
  Filled 2015-10-25 (×2): qty 10

## 2015-10-25 MED ORDER — LEVOTHYROXINE SODIUM 75 MCG PO TABS
175.0000 ug | ORAL_TABLET | Freq: Every day | ORAL | Status: DC
  Administered 2015-10-25 – 2015-10-30 (×6): 175 ug via ORAL
  Filled 2015-10-25 (×6): qty 1

## 2015-10-25 MED ORDER — OXYCODONE HCL 5 MG PO TABS
5.0000 mg | ORAL_TABLET | ORAL | Status: DC | PRN
  Administered 2015-10-26 (×4): 5 mg via ORAL
  Filled 2015-10-25 (×4): qty 1

## 2015-10-25 NOTE — ED Notes (Signed)
Lunch tray at bedside. ?

## 2015-10-25 NOTE — Progress Notes (Signed)
Echocardiogram 2D Echocardiogram has been performed with definity.  Marisue Humblelexis N Xuan Mateus 10/25/2015, 4:51 PM

## 2015-10-25 NOTE — Progress Notes (Signed)
ANTICOAGULATION CONSULT NOTE  Pharmacy Consult for heparin Indication: pulmonary embolus  Allergies  Allergen Reactions  . Sulfa Antibiotics Hives    Patient Measurements: Height: 5\' 5"  (165.1 cm) Weight: 154 lb 12.2 oz (70.2 kg) IBW/kg (Calculated) : 57 Heparin Dosing Weight: 71 kg  Vital Signs: Temp: 97.9 F (36.6 C) (09/15 1335) Temp Source: Oral (09/15 1335) BP: 123/96 (09/15 1335) Pulse Rate: 89 (09/15 1335)  Labs:  Recent Labs  10/24/15 1856 10/25/15 0604 10/25/15 1554  HGB 14.0 13.3  --   HCT 41.9 39.4  --   PLT 183 166  --   HEPARINUNFRC  --  0.76* 0.41  CREATININE 0.93 0.83  --   TROPONINI  --  0.31*  --     Estimated Creatinine Clearance: 83.3 mL/min (by C-G formula based on SCr of 0.83 mg/dL).   Medical History: Past Medical History:  Diagnosis Date  . Headache   . Hypertension   . PE (pulmonary embolism) 10/2015  . Thyroid disease    hypothyroidism    Assessment: 45 yo female admitted with chest pain and shortness of breath. CT positive for PE with right heart strain. Also suggestive of thin saddle component per report. CBC stable. No anticoagulation prior to admission. No signs or symptoms of bleeding noted.   Initial heparin level is therapeutic 0.41. Plan to transition to NOAC at discharge.  Goal of Therapy:  Heparin level 0.3-0.7 units/ml Monitor platelets by anticoagulation protocol: Yes   Plan:  -Reduce heparin to 1050 units/hr -Daily HL, CBC -Recheck confirmatory level in 6 hours   Ruben Imony Mattalyn Anderegg, PharmD Clinical Pharmacist Pager: [phone] 10/25/2015 4:44 PM

## 2015-10-25 NOTE — ED Notes (Signed)
Breakfast tray at bedside 

## 2015-10-25 NOTE — Progress Notes (Addendum)
Preliminary results by tech - Venous Duplex Lower Ext. Completed. Positive for acute deep vein thrombosis involving the left distal femoral vein, popliteal vein and peroneal,  posterior tibial veins. Right Leg, negative for deep and superficial vein thrombosis. Results given to [facility],  patient's nurse.  Marilynne Halstedita Kember Boch, BS, RDMS, RVT

## 2015-10-25 NOTE — Progress Notes (Signed)
[facility] TEAM 1 - Stepdown/ICU TEAM  [name]  ZOX:[id number] DOB: 03-25-1970 DOA: 10/24/2015 PCP: Katy Apo, MD    Brief Narrative:  45 y.o. female with a history of hypothyroidism and HTN who presented with sudden onset pleuritic chest pain and SOB.  In the ED a CTa of the chest showed bilateral PEs.  Of note, she had a bunionectomy on her left foot in July, has been in a boot since then, noting some intermittent mild left foot swelling from time to time.  Subjective: The patient is resting comfortably at the time of visit.  She denies chest pain nausea vomiting or abdominal pain.  She has noticed ongoing swelling in her left leg.  She complains of dyspnea with the slightest bit of exertion, including even just sitting up in bed.  Assessment & Plan:  Acute saddle PE, submassive, some heart strain, first unprovoked:  -PCCM following  -TTE pending  -Continue heparin drip - investigate cost to pt for NOAC v/s coumadin - will likely need full year of anticoag  Acute extensive DVT LLE  -no evidence of mobile clot as per prelim report   Hypothyroidism -Continue levothyroxine  HTN -BP stabilizing - resume lower does BB to avoid "withdrawal" and follow    DVT prophylaxis: IV heparin  Code Status: FULL CODE Family Communication: spoke w/ mother and daughter at bedside  Disposition Plan: SDU   Consultants:  PCCM  Procedures: 9/15 - venoud duplex - extensive L LE DVT TTE - pending   Antimicrobials:  none  Objective: Blood pressure 109/79, pulse 85, resp. rate 21, height 5\' 4"  (1.626 m), weight 76.7 kg (169 lb 1.5 oz), last menstrual period 10/17/2015, SpO2 98 %.  Intake/Output Summary (Last 24 hours) at 10/25/15 1135 Last data filed at 10/25/15 0345  Gross per 24 hour  Intake                0 ml  Output              450 ml  Net             -450 ml   Filed Weights   10/24/15 2223  Weight: 76.7 kg (169 lb 1.5 oz)    Examination: General: No acute  respiratory distress Lungs: Clear to auscultation bilaterally without wheezes or crackles Cardiovascular: Regular rate and rhythm without murmur gallop or rub normal S1 and S2 Abdomen: Nontender, nondistended, soft, bowel sounds positive, no rebound, no ascites, no appreciable mass Extremities: No significant cyanosis, or clubbing - 1+ edema L LE - no edema R LE   CBC:  Recent Labs Lab 10/24/15 1856 10/25/15 0604  WBC 12.2* 13.4*  HGB 14.0 13.3  HCT 41.9 39.4  MCV 92.1 91.2  PLT 183 166   Basic Metabolic Panel:  Recent Labs Lab 10/24/15 1856 10/25/15 0604  NA 136 137  K 4.4 3.9  CL 106 108  CO2 22 20*  GLUCOSE 138* 103*  BUN 12 10  CREATININE 0.93 0.83  CALCIUM 9.6 9.3   GFR: Estimated Creatinine Clearance: 84.9 mL/min (by C-G formula based on SCr of 0.83 mg/dL).  Liver Function Tests:  Recent Labs Lab 10/25/15 0604  AST 22  ALT 19  ALKPHOS 44  BILITOT 0.8  PROT 6.7  ALBUMIN 3.4*    Coagulation Profile: No results for input(s): INR, PROTIME in the last 168 hours.  Cardiac Enzymes:  Recent Labs Lab 10/25/15 0604  TROPONINI 0.31*     Scheduled Meds: .  atenolol  12.5 mg Oral Daily  . levothyroxine  175 mcg Oral QAC breakfast   Continuous Infusions: . heparin 1,050 Units/hr (10/25/15 0644)     LOS: 1 day   Lonia BloodJeffrey T. McClung, MD [facility] Office  [phone] Pager - Text Page per Amion as per below:  On-Call/Text Page:      [email or web address]      password [email or web address]  If 7PM-7AM, please contact night-coverage [email or web address] Password [email or web address] 10/25/2015, 11:35 AM

## 2015-10-25 NOTE — Progress Notes (Signed)
ANTICOAGULATION CONSULT NOTE  Pharmacy Consult for heparin Indication: pulmonary embolus  Allergies  Allergen Reactions  . Sulfa Antibiotics Hives    Patient Measurements: Height: 5\' 4"  (162.6 cm) Weight: 169 lb 1.5 oz (76.7 kg) IBW/kg (Calculated) : 54.7 Heparin Dosing Weight: 71 kg  Vital Signs: BP: 106/88 (09/15 0430) Pulse Rate: 79 (09/15 0430)  Labs:  Recent Labs  10/24/15 1856 10/25/15 0604  HGB 14.0 13.3  HCT 41.9 39.4  PLT 183 166  HEPARINUNFRC  --  0.76*  CREATININE 0.93  --     Estimated Creatinine Clearance: 75.8 mL/min (by C-G formula based on SCr of 0.93 mg/dL).   Medical History: Past Medical History:  Diagnosis Date  . Hypertension   . Thyroid disease    hypothyroidism    Assessment: 45 yo female admitted with chest pain and shortness of breath. CT positive for PE with right heart strain. Also suggestive of thin saddle component per report. CBC stable. No anticoagulation prior to admission. No signs or symptoms of bleeding noted.   Initial heparin level is supratherapeutic.  Goal of Therapy:  Heparin level 0.3-0.7 units/ml Monitor platelets by anticoagulation protocol: Yes   Plan:  -Reduce heparin to 1050 units/hr -Daily HL, CBC -Recheck confirmatory level in 6 hours    Agapito GamesAlison Paulyne Mooty, PharmD, BCPS Clinical Pharmacist 10/25/2015 6:33 AM

## 2015-10-25 NOTE — Progress Notes (Signed)
10/25/2015  Patient transfer from Emergency room to 2 Central at 1400. She is alert, oriented and ambulatory. Patient was on heparin drip, because PE and clot in left leg. Leg was elevated on a pillow, leg felt warm to touch. Patient have surgery on left foot scarring on toe and heel.  Patient receive CHG bath and MRSA swab. Patient have no c/o pain. [facility] HixsonNadine Miriam Liles RN.

## 2015-10-25 NOTE — Care Management Note (Signed)
Case Management Note  Patient Details  Name: [name] MRN: [id number] Date of Birth: 04/09/1970  Subjective/Objective:                  45 year old female with a past medical history of hypertension hypothyroidism comes in today complaining of chest pain. /From home.  Action/Plan: Follow for disposition needs.   Expected Discharge Date:  10/28/15               Expected Discharge Plan:  Home/Self Care  In-House Referral:  NA  Discharge planning Services  CM Consult  Post Acute Care Choice:    Choice offered to:     DME Arranged:    DME Agency:     HH Arranged:    HH Agency:     Status of Service:     If discussed at [employer] Meetings, dates discussed:    Additional Comments:  Oletta CohnWood, Maida Widger, RN 10/25/2015, 10:18 AM

## 2015-10-25 NOTE — Consult Note (Signed)
Name: PENNELOPE BASQUE MRN: [id number] DOB: 08/01/1970    ADMISSION DATE:  10/24/2015 CONSULTATION DATE:  9/14  REFERRING MD :  Dr. Dalene Seltzer EDP  CHIEF COMPLAINT:  CP/SOB  HISTORY OF PRESENT ILLNESS:  45 year old female with PMH of hypothyroidism and hypertension who underwent surgery on her L foot for bone spur 7/24. Leg was immobilized and she was non-weight bearing for 3 weeks post-operatively. She is a never smoker and is on birth control in the form of a NuVa Ring. Post operative course uncomplicated until 9/13 when she developed CP and SOB worse with activity.This persisted x1 day when she reported to [facility] ED 9/14 with these complaints. CT angiogram consistent with saddle PE with RV/LV ratio 2.0. PCCM asked to see for further evaluation.   SIGNIFICANT EVENTS  7/24 L foot bone spur surgery > immobile for 3 weeks pos op.  9/14 admit for submassive PE  STUDIES:  CT angio 9/14 > Positive for acute PE with CT evidence of right heart strain (RV/LV Ratio = 2.0) consistent with at least submassive (intermediate risk) PE. Moderate to large clot burden with thin saddle component.  SUBJECTIVE:  No issues overnight. Comfortable. Not in distress.  On heparin drip.   VITAL SIGNS: Temp:  [97.9 F (36.6 C)-99 F (37.2 C)] 97.9 F (36.6 C) (09/15 1335) Pulse Rate:  [79-120] 89 (09/15 1335) Resp:  [16-29] 21 (09/15 1335) BP: (99-138)/(70-98) 123/96 (09/15 1335) SpO2:  [96 %-100 %] 98 % (09/15 1335) Weight:  [70.2 kg (154 lb 12.2 oz)-76.7 kg (169 lb 1.5 oz)] 70.2 kg (154 lb 12.2 oz) (09/15 1335)  PHYSICAL EXAMINATION: General:  Female of normal body habitus in NAD Neuro:  Alert, oriented, non-focal HEENT:  [place]/AT, PERRL, no JVD Cardiovascular:  RRR, no MRG Lungs:  Clear bilateral breath sounds Abdomen:  Soft, non-tender, non-distended Musculoskeletal:  No acute deformity or ROM limitation.  NO edema, erythema, warmth to either lower extremity Skin:  Grossly intact   Recent  Labs Lab 10/24/15 1856 10/25/15 0604  NA 136 137  K 4.4 3.9  CL 106 108  CO2 22 20*  BUN 12 10  CREATININE 0.93 0.83  GLUCOSE 138* 103*    Recent Labs Lab 10/24/15 1856 10/25/15 0604  HGB 14.0 13.3  HCT 41.9 39.4  WBC 12.2* 13.4*  PLT 183 166   Dg Chest 2 View  Result Date: 10/24/2015 CLINICAL DATA:  Chest pain EXAM: CHEST  2 VIEW COMPARISON:  None. FINDINGS: The heart size and mediastinal contours are within normal limits. Both lungs are clear. The visualized skeletal structures are unremarkable. IMPRESSION: No active cardiopulmonary disease. Electronically Signed   By: Bary Richard M.D.   On: 10/24/2015 19:34   Ct Angio Chest Pe W And/or Wo Contrast  Result Date: 10/24/2015 CLINICAL DATA:  Chest pain and shortness of breath since yesterday. EXAM: CT ANGIOGRAPHY CHEST WITH CONTRAST TECHNIQUE: Multidetector CT imaging of the chest was performed using the standard protocol during bolus administration of intravenous contrast. Multiplanar CT image reconstructions and MIPs were obtained to evaluate the vascular anatomy. CONTRAST:  100 cc Isovue 370 IV COMPARISON:  Chest radiograph earlier this date FINDINGS: Cardiovascular: Positive for acute pulmonary embolus with thin saddle embolus and clot extending into both main pulmonary arteries, lobar and segmental branches. Clot burden is moderate to large. The RV to LV ratio is 2.0 consistent with right heart strain. There is straightening of the intraventricular septum and contrast refluxing into the hepatic veins. Normal caliber thoracic  aorta. Mediastinum/Nodes: No enlarged mediastinal, hilar, or axillary lymph nodes. Thyroid gland, trachea, and esophagus demonstrate no significant findings. Lungs/Pleura: No pulmonary infarct. Lungs are clear. No pleural fluid. Upper Abdomen: No acute abnormality. Musculoskeletal: No chest wall abnormality. No acute or significant osseous findings. Review of the MIP images confirms the above findings.  IMPRESSION: Positive for acute PE with CT evidence of right heart strain (RV/LV Ratio = 2.0) consistent with at least submassive (intermediate risk) PE. The presence of right heart strain has been associated with an increased risk of morbidity and mortality. Please activate Code PE by paging [phone]. Moderate to large clot burden with thin saddle component. Critical Value/emergent results were called by telephone at the time of interpretation on 10/24/2015 at 9:45 pm to Dr. Caren GriffinsADAM LUCKEY , who verbally acknowledged these results. Electronically Signed   By: Rubye OaksMelanie  Ehinger M.D.   On: 10/24/2015 21:46    ASSESSMENT / PLAN:  Saddle pulmonary embolism, provoked as she has been sedentary/deconditioned from recent foot surgery in July 2017. Pt is on oral contraceptives. Haemodynamically stable.     - Cont  heparin infusion per pharmacy. Transition to NOAC. Anticipate she will need to be on NOAC for at least 3 mo.s as PE is provoked.  If she gets discharged, will need f/u with pulmonary.  Suggest rpt chest CTA in 3-4 mos to assess clot burden. Concern for Chronic PE or pulm HTN. - F/U Echo and Dopplers.  - Need to address contraception as well - PCCM will sign off for now; call back if with issues over the weekend. - plan d/w pt and father.    Pollie MeyerJ. Angelo A de Dios, MD 10/25/2015, 3:14 PM [facility] Pager [phone] After 3 pm or if no answer, call [phone]

## 2015-10-25 NOTE — ED Notes (Signed)
Assisted pt to bedside commode.

## 2015-10-25 NOTE — ED Notes (Signed)
Brought pt water to drink per [place], [place]

## 2015-10-26 LAB — COMPREHENSIVE METABOLIC PANEL
ALK PHOS: 41 U/L (ref 38–126)
ALT: 21 U/L (ref 14–54)
ANION GAP: 9 (ref 5–15)
AST: 18 U/L (ref 15–41)
Albumin: 3.1 g/dL — ABNORMAL LOW (ref 3.5–5.0)
BUN: 11 mg/dL (ref 6–20)
CALCIUM: 9.1 mg/dL (ref 8.9–10.3)
CHLORIDE: 106 mmol/L (ref 101–111)
CO2: 22 mmol/L (ref 22–32)
Creatinine, Ser: 0.88 mg/dL (ref 0.44–1.00)
GFR calc non Af Amer: >60 mL/min (ref >60)
Glucose, Bld: 104 mg/dL — ABNORMAL HIGH (ref 65–99)
Potassium: 3.9 mmol/L (ref 3.5–5.1)
SODIUM: 137 mmol/L (ref 135–145)
Total Bilirubin: 0.5 mg/dL (ref 0.3–1.2)
Total Protein: 7 g/dL (ref 6.5–8.1)

## 2015-10-26 LAB — CBC
HCT: 40.3 % (ref 36.0–46.0)
Hemoglobin: 13.3 g/dL (ref 12.0–15.0)
MCH: 30.3 pg (ref 26.0–34.0)
MCHC: 33 g/dL (ref 30.0–36.0)
MCV: 91.8 fL (ref 78.0–100.0)
PLATELETS: 178 10*3/uL (ref 150–400)
RBC: 4.39 MIL/uL (ref 3.87–5.11)
RDW: 12.3 % (ref 11.5–15.5)
WBC: 10.7 10*3/uL — ABNORMAL HIGH (ref 4.0–10.5)

## 2015-10-26 LAB — ECHOCARDIOGRAM COMPLETE
Height: 65 in
WEIGHTICAEL: 2476.21 [oz_av]

## 2015-10-26 LAB — HEPARIN LEVEL (UNFRACTIONATED): Heparin Unfractionated: 0.45 IU/mL (ref 0.30–0.70)

## 2015-10-26 MED ORDER — ATENOLOL 25 MG PO TABS
25.0000 mg | ORAL_TABLET | Freq: Every day | ORAL | Status: DC
  Administered 2015-10-27: 25 mg via ORAL
  Filled 2015-10-26: qty 1

## 2015-10-26 MED ORDER — OXYCODONE HCL 5 MG PO TABS
5.0000 mg | ORAL_TABLET | ORAL | Status: DC | PRN
  Administered 2015-10-26 – 2015-10-29 (×13): 10 mg via ORAL
  Administered 2015-10-30: 5 mg via ORAL
  Filled 2015-10-26 (×5): qty 2
  Filled 2015-10-26: qty 1
  Filled 2015-10-26 (×9): qty 2

## 2015-10-26 MED ORDER — MORPHINE SULFATE (PF) 4 MG/ML IV SOLN
4.0000 mg | INTRAVENOUS | Status: DC | PRN
  Administered 2015-10-27: 4 mg via INTRAVENOUS
  Filled 2015-10-26: qty 1

## 2015-10-26 NOTE — Progress Notes (Signed)
ANTICOAGULATION CONSULT NOTE  Pharmacy Consult for heparin Indication: pulmonary embolus  Allergies  Allergen Reactions  . Sulfa Antibiotics Hives    Patient Measurements: Height: 5\' 5"  (165.1 cm) Weight: 154 lb 12.2 oz (70.2 kg) IBW/kg (Calculated) : 57 Heparin Dosing Weight: 71 kg  Vital Signs: Temp: 98.3 F (36.8 C) (09/16 0403) Temp Source: Oral (09/16 0403) BP: 100/70 (09/16 0403) Pulse Rate: 86 (09/16 0000)  Labs:  Recent Labs  10/24/15 1856  10/25/15 0604 10/25/15 1554 10/25/15 2241 10/26/15 0411  HGB 14.0  --  13.3  --   --  13.3  HCT 41.9  --  39.4  --   --  40.3  PLT 183  --  166  --   --  178  HEPARINUNFRC  --   < > 0.76* 0.41 0.30 0.45  CREATININE 0.93  --  0.83  --   --  0.88  TROPONINI  --   --  0.31*  --   --   --   < > = values in this interval not displayed.  Estimated Creatinine Clearance: 78.6 mL/min (by C-G formula based on SCr of 0.88 mg/dL).  Assessment: 45 y.o. female with PE for heparin.    Heparin level therapeutic on 1150 units/hr.  No bleeding noted. CBC stable. SCr stable.   Goal of Therapy:  Heparin level 0.3-0.7 units/ml Monitor platelets by anticoagulation protocol: Yes   Plan:  Continue Heparin 1150 units/hr to keep within goal range Daily heparin level and CBC while on therapy.  Follow-up transition to oral therapy  Link SnufferJessica Dylanie Quesenberry, PharmD, BCPS Clinical Pharmacist [phone] 10/26/2015 7:58 AM

## 2015-10-26 NOTE — Progress Notes (Signed)
ANTICOAGULATION CONSULT NOTE  Pharmacy Consult for heparin Indication: pulmonary embolus  Allergies  Allergen Reactions  . Sulfa Antibiotics Hives    Patient Measurements: Height: 5\' 5"  (165.1 cm) Weight: 154 lb 12.2 oz (70.2 kg) IBW/kg (Calculated) : 57 Heparin Dosing Weight: 71 kg  Vital Signs: Temp: 98.4 F (36.9 C) (09/15 2354) Temp Source: Oral (09/15 2354) BP: 114/86 (09/15 1952) Pulse Rate: 89 (09/15 1335)  Labs:  Recent Labs  10/24/15 1856 10/25/15 0604 10/25/15 1554 10/25/15 2241  HGB 14.0 13.3  --   --   HCT 41.9 39.4  --   --   PLT 183 166  --   --   HEPARINUNFRC  --  0.76* 0.41 0.30  CREATININE 0.93 0.83  --   --   TROPONINI  --  0.31*  --   --     Estimated Creatinine Clearance: 83.3 mL/min (by C-G formula based on SCr of 0.83 mg/dL).  Assessment: 45 y.o. female with PE for heparin.  Heparin level decreased to lowest end of goal range.  Goal of Therapy:  Heparin level 0.3-0.7 units/ml Monitor platelets by anticoagulation protocol: Yes   Plan:  Increase Heparin 1150 units/hr to keep within goal range Follow-up am labs.  Carolyn RisenGreg Izetta Ross, PharmD, BCPS  10/26/2015 12:07 AM

## 2015-10-26 NOTE — Progress Notes (Signed)
[facility] TEAM 1 - Stepdown/ICU TEAM  [name]  ZOX:[id number] DOB: 07-07-1970 DOA: 10/24/2015 PCP: Katy Apo, MD    Brief Narrative:  45 y.o. female with a history of hypothyroidism and HTN who presented with sudden onset pleuritic chest pain and SOB.  In the ED a CTa of the chest showed bilateral PEs.  Of note, she had a bunionectomy on her left foot in July, has been in a boot since then, noting some intermittent mild left foot swelling from time to time.  Subjective: The patient is having migratory pleuritic type chest pains.  They're primarily located on the right side today.  She is also experiencing significant tachycardia with the slightest bit of exertion.  She denies hemoptysis abdominal pain nausea or vomiting.  She has not yet tried to get out of bed.  Assessment & Plan:  Acute saddle PE, submassive - Mod R heart strain  -PCCM has evaluated -TTE notes moderate RV strain/R heart failure but not to extent further intervention indicated  -Continue heparin drip - investigate cost to pt for NOAC v/s coumadin - discussed pros/cons of all oral antocoag options at length w/ pt and her mother   Acute extensive DVT LLE  -no evidence of mobile clot as per prelim report - swelling in leg improved  Hypothyroidism -Continue levothyroxine  HTN -BP stabilizing - slowly increase BB back to her usual home dose    DVT prophylaxis: IV heparin  Code Status: FULL CODE Family Communication: spoke w/ mother at bedside  Disposition Plan: SDU   Consultants:  PCCM  Procedures: 9/15 - venous duplex - extensive L LE DVT 9/15 TTE - EF 60-65 % - wall motion normal with no regional wall motion of the modalities - grade 1 diastolic dysfunction - RV moderately dilated with moderate systolic function reduction  Antimicrobials:  none  Objective: Blood pressure 120/86, pulse 88, temperature 97.7 F (36.5 C), temperature source Oral, resp. rate (!) 28, height 5\' 5"  (1.651 m),  weight 70.2 kg (154 lb 12.2 oz), last menstrual period 10/17/2015, SpO2 97 %.  Intake/Output Summary (Last 24 hours) at 10/26/15 1624 Last data filed at 10/26/15 1600  Gross per 24 hour  Intake          1247.76 ml  Output                0 ml  Net          1247.76 ml   Filed Weights   10/24/15 2223 10/25/15 1335  Weight: 76.7 kg (169 lb 1.5 oz) 70.2 kg (154 lb 12.2 oz)    Examination: General: No acute respiratory distress - alert and oriented Lungs: Clear to auscultation bilaterally without wheezes or crackles Cardiovascular: Regular rate and rhythm without murmur gallop or rub  Abdomen: Nontender, nondistended, soft, bowel sounds positive, no ascites, no appreciable mass Extremities: No significant cyanosis, or clubbing - trace edema L LE - no edema R LE   CBC:  Recent Labs Lab 10/24/15 1856 10/25/15 0604 10/26/15 0411  WBC 12.2* 13.4* 10.7*  HGB 14.0 13.3 13.3  HCT 41.9 39.4 40.3  MCV 92.1 91.2 91.8  PLT 183 166 178   Basic Metabolic Panel:  Recent Labs Lab 10/24/15 1856 10/25/15 0604 10/26/15 0411  NA 136 137 137  K 4.4 3.9 3.9  CL 106 108 106  CO2 22 20* 22  GLUCOSE 138* 103* 104*  BUN 12 10 11   CREATININE 0.93 0.83 0.88  CALCIUM 9.6 9.3 9.1  GFR: Estimated Creatinine Clearance: 78.6 mL/min (by C-G formula based on SCr of 0.88 mg/dL).  Liver Function Tests:  Recent Labs Lab 10/25/15 0604 10/26/15 0411  AST 22 18  ALT 19 21  ALKPHOS 44 41  BILITOT 0.8 0.5  PROT 6.7 7.0  ALBUMIN 3.4* 3.1*    Cardiac Enzymes:  Recent Labs Lab 10/25/15 0604  TROPONINI 0.31*     Scheduled Meds: . atenolol  12.5 mg Oral Daily  . levothyroxine  175 mcg Oral QAC breakfast   Continuous Infusions: . heparin 1,150 Units/hr (10/26/15 1321)     LOS: 2 days   Lonia BloodJeffrey T. McClung, MD [facility] Office  [phone] Pager - Text Page per Amion as per below:  On-Call/Text Page:      [email or web address]      password [email or web address]  If 7PM-7AM, please contact  night-coverage [email or web address] Password [email or web address] 10/26/2015, 4:24 PM

## 2015-10-27 LAB — CBC
HCT: 39 % (ref 36.0–46.0)
HEMOGLOBIN: 12.9 g/dL (ref 12.0–15.0)
MCH: 30.1 pg (ref 26.0–34.0)
MCHC: 33.1 g/dL (ref 30.0–36.0)
MCV: 91.1 fL (ref 78.0–100.0)
Platelets: 177 10*3/uL (ref 150–400)
RBC: 4.28 MIL/uL (ref 3.87–5.11)
RDW: 12.4 % (ref 11.5–15.5)
WBC: 10.1 10*3/uL (ref 4.0–10.5)

## 2015-10-27 LAB — HEPARIN LEVEL (UNFRACTIONATED)
HEPARIN UNFRACTIONATED: 0.38 [IU]/mL (ref 0.30–0.70)
HEPARIN UNFRACTIONATED: 0.22 [IU]/mL — AB (ref 0.30–0.70)

## 2015-10-27 MED ORDER — ATENOLOL 50 MG PO TABS
50.0000 mg | ORAL_TABLET | Freq: Every day | ORAL | Status: DC
  Administered 2015-10-28 – 2015-10-30 (×3): 50 mg via ORAL
  Filled 2015-10-27 (×3): qty 1

## 2015-10-27 MED ORDER — KETOROLAC TROMETHAMINE 30 MG/ML IJ SOLN
30.0000 mg | Freq: Four times a day (QID) | INTRAMUSCULAR | Status: DC
  Administered 2015-10-27 – 2015-10-30 (×11): 30 mg via INTRAVENOUS
  Filled 2015-10-27 (×11): qty 1

## 2015-10-27 MED ORDER — MORPHINE SULFATE (PF) 4 MG/ML IV SOLN: 4.0000 mg | INTRAVENOUS | Status: DC | PRN

## 2015-10-27 NOTE — Progress Notes (Signed)
Patient asking to schedule pain medications prior to being in pain so it can stay in her system.  Patient asked NT and RN if she could "preorder her pain medication." Again explained the difference of scheduled and as needed medications.

## 2015-10-27 NOTE — Progress Notes (Signed)
[facility] TEAM 1 - Stepdown/ICU TEAM  [name]  JYN:[id number] DOB: 07/26/1970 DOA: 10/24/2015 PCP: Katy ApoPOLITE,RONALD D, MD    Brief Narrative:  45 y.o. female with a history of hypothyroidism and HTN who presented with sudden onset pleuritic chest pain and SOB.  In the ED a CTa of the chest showed bilateral PEs.  Of note, she had a bunionectomy on her left foot in July, has been in a boot since then, noting some intermittent mild left foot swelling from time to time.  Subjective: The patient has attempted to be more active today.  She has suffered severe right-sided pleuritic type chest pain with attempts to move.  She reports the pain is not well controlled with her current pain regimen.  She denies significant shortness of breath while at rest, abdominal pain, nausea vomiting.  Assessment & Plan:  Acute saddle PE, submassive - Mod R heart strain  -PCCM has evaluated -TTE noted moderate RV strain/R heart failure but not to extent further intervention indicated  -Continue heparin drip - investigate cost to pt for NOAC v/s coumadin - discussed pros/cons of all oral antocoag options at length w/ pt and her mother   Severe pleuritic right-sided chest pain -Due to above - follow-up chest x-ray in a.m. - titrate pain meds - add scheduled Toradol for 5 day course  Acute extensive DVT LLE  -no evidence of mobile clot as per prelim report - swelling in leg improved  Hypothyroidism -Continue levothyroxine  HTN -BP stabilizing - slowly increase BB back to her usual home dose    DVT prophylaxis: IV heparin  Code Status: FULL CODE Family Communication: spoke w/ mother at bedside  Disposition Plan: SDU   Consultants:  PCCM  Procedures: 9/15 - venous duplex - extensive L LE DVT 9/15 TTE - EF 60-65 % - wall motion normal with no regional wall motion of the modalities - grade 1 diastolic dysfunction - RV moderately dilated with moderate systolic function  reduction  Antimicrobials:  none  Objective: Blood pressure 98/68, pulse 91, temperature 97.9 F (36.6 C), temperature source Oral, resp. rate 15, height 5\' 5"  (1.651 m), weight 70.2 kg (154 lb 12.2 oz), last menstrual period 10/17/2015, SpO2 98 %.  Intake/Output Summary (Last 24 hours) at 10/27/15 1539 Last data filed at 10/27/15 1300  Gross per 24 hour  Intake            796.5 ml  Output                0 ml  Net            796.5 ml   Filed Weights   10/24/15 2223 10/25/15 1335  Weight: 76.7 kg (169 lb 1.5 oz) 70.2 kg (154 lb 12.2 oz)    Examination: General: No acute respiratory distress  Lungs: Clear to auscultation bilaterally - no wheezes or crackles Cardiovascular: Regular rate and rhythm without murmur  Abdomen: Nontender, nondistended, soft, bowel sounds positive, no ascites Extremities: No significant cyanosis, or clubbing - trace edema L LE - no edema R LE   CBC:  Recent Labs Lab 10/24/15 1856 10/25/15 0604 10/26/15 0411 10/27/15 0428  WBC 12.2* 13.4* 10.7* 10.1  HGB 14.0 13.3 13.3 12.9  HCT 41.9 39.4 40.3 39.0  MCV 92.1 91.2 91.8 91.1  PLT 183 166 178 177   Basic Metabolic Panel:  Recent Labs Lab 10/24/15 1856 10/25/15 0604 10/26/15 0411  NA 136 137 137  K 4.4 3.9 3.9  CL  106 108 106  CO2 22 20* 22  GLUCOSE 138* 103* 104*  BUN 12 10 11   CREATININE 0.93 0.83 0.88  CALCIUM 9.6 9.3 9.1   GFR: Estimated Creatinine Clearance: 78.6 mL/min (by C-G formula based on SCr of 0.88 mg/dL).  Liver Function Tests:  Recent Labs Lab 10/25/15 0604 10/26/15 0411  AST 22 18  ALT 19 21  ALKPHOS 44 41  BILITOT 0.8 0.5  PROT 6.7 7.0  ALBUMIN 3.4* 3.1*    Cardiac Enzymes:  Recent Labs Lab 10/25/15 0604  TROPONINI 0.31*     Scheduled Meds: . atenolol  25 mg Oral Daily  . levothyroxine  175 mcg Oral QAC breakfast   Continuous Infusions: . heparin 1,150 Units/hr (10/27/15 1142)     LOS: 3 days   Lonia Blood, MD [facility] Office  [phone] Pager - Text Page per Amion as per below:  On-Call/Text Page:      [email or web address]      password [email or web address]  If 7PM-7AM, please contact night-coverage [email or web address] Password [facility] 10/27/2015, 3:39 PM

## 2015-10-27 NOTE — Plan of Care (Signed)
Problem: Safety: Goal: Ability to remain free from injury will improve Outcome: Progressing Patient calls for assitance OOB. Call light within reach and room kept free from clutter. Patient stable but needs standby assistance.  Problem: Pain Managment: Goal: General experience of comfort will improve Outcome: Progressing Patient continues to complain of increased sharp pain to right flank. Per MD patient's medications adjusted to provide for scheduled toradol with morphine for breakthrough pain. Patient did experience relief with toradol, but knows to call staff if she needs medication.   Problem: Tissue Perfusion: Goal: Risk factors for ineffective tissue perfusion will decrease Outcome: Progressing Patient on heparin gtt for known PE/DVT. Continuing to monitor for effectiveness.   Problem: Nutrition: Goal: Adequate nutrition will be maintained Outcome: Completed/Met Date Met: 10/27/15 Patient with good appetite, eating >75 % of meals.

## 2015-10-27 NOTE — Progress Notes (Addendum)
ANTICOAGULATION CONSULT NOTE  Pharmacy Consult for heparin Indication: pulmonary embolus  Allergies  Allergen Reactions  . Sulfa Antibiotics Hives    Patient Measurements: Height: 5\' 5"  (165.1 cm) Weight: 154 lb 12.2 oz (70.2 kg) IBW/kg (Calculated) : 57 Heparin Dosing Weight: 71 kg  Vital Signs: Temp: 98.2 F (36.8 C) (09/17 0313) Temp Source: Oral (09/17 0313) BP: 106/75 (09/17 0600) Pulse Rate: 78 (09/17 0600)  Labs:  Recent Labs  10/24/15 1856 10/25/15 0604  10/25/15 2241 10/26/15 0411 10/27/15 0428  HGB 14.0 13.3  --   --  13.3 12.9  HCT 41.9 39.4  --   --  40.3 39.0  PLT 183 166  --   --  178 177  HEPARINUNFRC  --  0.76*  < > 0.30 0.45 0.38  CREATININE 0.93 0.83  --   --  0.88  --   TROPONINI  --  0.31*  --   --   --   --   < > = values in this interval not displayed.  Estimated Creatinine Clearance: 78.6 mL/min (by C-G formula based on SCr of 0.88 mg/dL).  Assessment: 45 y.o. female with bilateral PEs for heparin.  Large clot burden noted on CT.   Heparin level is therapeutic (0.38) on 1150 units/hr.  No bleeding noted. CBC stable. SCr stable.  Patient was experiencing continued chest pain and significant tachycardia throughout the day yesterday. Currently in NSR with HRs 70-80s. Pain 8/10. Satting well on room air.   Goal of Therapy:  Heparin level 0.3-0.7 units/ml Monitor platelets by anticoagulation protocol: Yes   Plan:  Continue Heparin 1150 units/hr to keep within goal range Due to large clot burden and heparin level trending down, will recheck this PM to ensure remains therapeutic.  Daily heparin level and CBC while on therapy.  Follow-up transition to oral therapy  Link SnufferJessica Uchenna Rappaport, PharmD, BCPS Clinical Pharmacist [phone] 10/27/2015 7:08 AM   Addendum: Repeat level dropped to 0.22 on 1150 units/hr.  Plan: Increase Heparin to 1350 units/hr.  Follow-up AM level.   Link SnufferJessica Willadene Mounsey, PharmD, BCPS Clinical Pharmacist [phone] 10/27/2015,  2:30 PM

## 2015-10-27 NOTE — Plan of Care (Signed)
Problem: Pain Managment: Goal: General experience of comfort will improve Outcome: Progressing Discussed the difference between scheduled pain medication and as needed with the patient some teach back displayed.

## 2015-10-28 LAB — CBC
HEMATOCRIT: 36.7 % (ref 36.0–46.0)
Hemoglobin: 12.2 g/dL (ref 12.0–15.0)
MCH: 30 pg (ref 26.0–34.0)
MCHC: 33.2 g/dL (ref 30.0–36.0)
MCV: 90.2 fL (ref 78.0–100.0)
Platelets: 181 10*3/uL (ref 150–400)
RBC: 4.07 MIL/uL (ref 3.87–5.11)
RDW: 12.3 % (ref 11.5–15.5)
WBC: 10.6 10*3/uL — ABNORMAL HIGH (ref 4.0–10.5)

## 2015-10-28 LAB — HEPARIN LEVEL (UNFRACTIONATED): Heparin Unfractionated: 0.43 IU/mL (ref 0.30–0.70)

## 2015-10-28 MED ORDER — RIVAROXABAN 20 MG PO TABS: 20.0000 mg | ORAL_TABLET | Freq: Every day | ORAL | Status: DC

## 2015-10-28 MED ORDER — RIVAROXABAN 15 MG PO TABS
15.0000 mg | ORAL_TABLET | Freq: Two times a day (BID) | ORAL | Status: DC
  Administered 2015-10-28 – 2015-10-30 (×5): 15 mg via ORAL
  Filled 2015-10-28 (×6): qty 1

## 2015-10-28 NOTE — Progress Notes (Signed)
CMA called [employer] to determine copay for Xarelto, Eliquis, and Pradaxa.  They would only tell her the copay would be 20% of the original price, no prior auth required.  TC to pt's pharmacy, [facility] on [address], and spoke with pharmacy tech.  He stated he could not run test scripts but, because of the $300 to $400 cost of each med, he would advise starting her on one that had a 0 or $10 copay card, which Xarelto and Eliquis both do.  Provided information to pt and attending.

## 2015-10-28 NOTE — Progress Notes (Signed)
[place] TEAM 1 - Stepdown/ICU TEAM  LASHANTE FRYBERGER  WUJ:[id number] DOB: 05-11-1970 DOA: 10/24/2015 PCP: Katy Apo, MD    Brief Narrative:  45 y.o. female with a history of hypothyroidism and HTN who presented with sudden onset pleuritic chest pain and SOB.  In the ED a CTa of the chest showed bilateral PEs.  Of note, she had a bunionectomy on her left foot in July, has been in a boot since then, noting some intermittent mild left foot swelling from time to time.  Subjective: Pleuritic chest pain is much better controlled with use of scheduled Toradol.  The patient has noted a small amount of bloody vaginal discharge consistent with very minimal menstrual bleeding.  She denies pelvic cramps or heavy bleeding.  She denies significant shortness of breath at rest but is not yet attempted to ambulate to any significant extent.  She denies headache nausea vomiting or abdominal pain.  Assessment & Plan:  Acute saddle PE, submassive - Mod R heart strain  -PCCM has evaluated -TTE noted moderate RV strain/R heart failure but not to extent further intervention indicated  -discussed pros/cons of all oral antocoag options at length w/ pt and her mother - she has chosen Xarelto - to transition today - case Production designer, theatre/television/film confirmed this is covered by her insurance company with a 20% co-pay - case manager to provide with 30 day free card and possible card to qualify for discounted co-pay -Begin to ambulate -Check oxygen saturations with ambulation -Given extensive clot burden in very serious nature of this episode the patient and I have decided to commit to 6 months of full anti-coagulation with reevaluation at the end of that treatment course  Severe pleuritic right-sided chest pain -due to above - follow-up chest x-ray in a.m. - much improved with scheduled Toradol for 5 day course  Acute extensive DVT LLE  -no evidence of mobile clot as per prelim report - swelling in leg essentially  resolved  Hypothyroidism -Continue levothyroxine  HTN -BP stabilizing - slowly increase BB back to her usual home dose   Minimal dysfunctional uterine bleeding -Not problematic at present but will need to follow - if worsens may require gynecologic evaluation - patient informs me she has annual Pap smears and these have always been normal - I have counseled her on the advisability of discontinuing hormone based contraception, especially once she is transitioned off anticoagulation   DVT prophylaxis: IV heparin > Xarelto Code Status: FULL CODE Family Communication: spoke w/ mother and father at bedside  Disposition Plan: Transfer to telemetry bed - begin physical therapy - monitor saturations with physical exertion  Consultants:  PCCM  Procedures: 9/15 - venous duplex - extensive L LE DVT 9/15 TTE - EF 60-65 % - wall motion normal with no regional wall motion of the modalities - grade 1 diastolic dysfunction - RV moderately dilated with moderate systolic function reduction  Antimicrobials:  none  Objective: Blood pressure (!) 108/53, pulse 75, temperature 98 F (36.7 C), temperature source Oral, resp. rate 18, height 5\' 5"  (1.651 m), weight 70.2 kg (154 lb 12.2 oz), last menstrual period 10/17/2015, SpO2 97 %.  Intake/Output Summary (Last 24 hours) at 10/28/15 1512 Last data filed at 10/28/15 1400  Gross per 24 hour  Intake           611.97 ml  Output                0 ml  Net  611.97 ml   Filed Weights   10/24/15 2223 10/25/15 1335  Weight: 76.7 kg (169 lb 1.5 oz) 70.2 kg (154 lb 12.2 oz)    Examination: General: No acute respiratory distress - alert and oriented Lungs: Clear to auscultation bilaterally without wheezes or crackles Cardiovascular: Regular rate and rhythm - no murmur or rub Abdomen: Nontender, nondistended, soft, bowel sounds positive, no ascites Extremities: No significant cyanosis, clubbing, or edema B LE  CBC:  Recent Labs Lab  10/24/15 1856 10/25/15 0604 10/26/15 0411 10/27/15 0428 10/28/15 0324  WBC 12.2* 13.4* 10.7* 10.1 10.6*  HGB 14.0 13.3 13.3 12.9 12.2  HCT 41.9 39.4 40.3 39.0 36.7  MCV 92.1 91.2 91.8 91.1 90.2  PLT 183 166 178 177 181   Basic Metabolic Panel:  Recent Labs Lab 10/24/15 1856 10/25/15 0604 10/26/15 0411  NA 136 137 137  K 4.4 3.9 3.9  CL 106 108 106  CO2 22 20* 22  GLUCOSE 138* 103* 104*  BUN 12 10 11   CREATININE 0.93 0.83 0.88  CALCIUM 9.6 9.3 9.1   GFR: Estimated Creatinine Clearance: 78.6 mL/min (by C-G formula based on SCr of 0.88 mg/dL).  Liver Function Tests:  Recent Labs Lab 10/25/15 0604 10/26/15 0411  AST 22 18  ALT 19 21  ALKPHOS 44 41  BILITOT 0.8 0.5  PROT 6.7 7.0  ALBUMIN 3.4* 3.1*    Cardiac Enzymes:  Recent Labs Lab 10/25/15 0604  TROPONINI 0.31*     Scheduled Meds: . atenolol  50 mg Oral Daily  . ketorolac  30 mg Intravenous Q6H  . levothyroxine  175 mcg Oral QAC breakfast   Continuous Infusions: . heparin 1,350 Units/hr (10/28/15 0418)     LOS: 4 days   Lonia BloodJeffrey T. McClung, MD [facility] Office  [phone] Pager - Text Page per Loretha [email or web address] as per below:  On-Call/Text Page:      [email or web address]      password [email or web address]  If 7PM-7AM, please contact night-coverage [email or web address] Password [email or web address] 10/28/2015, 3:12 PM

## 2015-10-28 NOTE — Progress Notes (Signed)
ANTICOAGULATION CONSULT NOTE - FOLLOW UP  Pharmacy Consult:  Heparin Indication:  New PE and LLE DVT  Allergies  Allergen Reactions  . Sulfa Antibiotics Hives    Patient Measurements: Height: 5\' 5"  (165.1 cm) Weight: 154 lb 12.2 oz (70.2 kg) IBW/kg (Calculated) : 57 Heparin Dosing Weight: 71 kg  Vital Signs: Temp: 98.4 F (36.9 C) (09/18 0723) Temp Source: Oral (09/18 0723) BP: 108/53 (09/18 0723) Pulse Rate: 81 (09/18 0723)  Labs:  Recent Labs  10/26/15 0411 10/27/15 0428 10/27/15 1136 10/28/15 0324  HGB 13.3 12.9  --  12.2  HCT 40.3 39.0  --  36.7  PLT 178 177  --  181  HEPARINUNFRC 0.45 0.38 0.22* 0.43  CREATININE 0.88  --   --   --     Estimated Creatinine Clearance: 78.6 mL/min (by C-G formula based on SCr of 0.88 mg/dL).  Assessment: 45 y.o. female with bilateral PEs and LLE DVT to continue on IV heparin.  Heparin level is therapeutic; no bleeding reported.   Goal of Therapy:  Heparin level 0.3-0.7 units/ml Monitor platelets by anticoagulation protocol: Yes    Plan:  - Continue heparin gtt at 1350 units/hr - Daily heparin level and CBC - F[facility]U [facility]   Joselito Fieldhouse D. Laney Potashang, PharmD, BCPS Pager:  [phone] 10/28/2015, 11:20 AM

## 2015-10-28 NOTE — Progress Notes (Signed)
ANTICOAGULATION CONSULT NOTE - Initial Consult  Pharmacy Consult for Xarelto Indication: pulmonary embolus  Allergies  Allergen Reactions  . Sulfa Antibiotics Hives   Patient Measurements: Height: 5\' 5"  (165.1 cm) Weight: 154 lb 12.2 oz (70.2 kg) IBW/kg (Calculated) : 57  Vital Signs: Temp: 98 F (36.7 C) (09/18 1219) Temp Source: Oral (09/18 1219) BP: 108/53 (09/18 0723) Pulse Rate: 75 (09/18 1219)  Labs:  Recent Labs  10/26/15 0411 10/27/15 0428 10/27/15 1136 10/28/15 0324  HGB 13.3 12.9  --  12.2  HCT 40.3 39.0  --  36.7  PLT 178 177  --  181  HEPARINUNFRC 0.45 0.38 0.22* 0.43  CREATININE 0.88  --   --   --     Estimated Creatinine Clearance: 78.6 mL/min (by C-G formula based on SCr of 0.88 mg/dL).  Medical History: Past Medical History:  Diagnosis Date  . Headache   . Hypertension   . PE (pulmonary embolism) 10/2015  . Thyroid disease    hypothyroidism   Assessment: 45 y.o. female with bilateral PEs and LLE DVT to transition from IV heparin to Xarelto. Hemoglobin and platelets are stable and within normal limits; no bleeding reported.  Goal of Therapy:  Monitor platelets by anticoagulation protocol: Yes   Plan:  Xarelto 15 mg BID for 21 days, then 20 mg daily Monitor CBC, renal function, and s/sx of bleeding  Ruben Imony Mavis Gravelle, PharmD Clinical Pharmacist Pager: [phone] 10/28/2015 4:23 PM

## 2015-10-29 ENCOUNTER — Inpatient Hospital Stay (HOSPITAL_COMMUNITY): Payer: Managed Care, Other (non HMO)

## 2015-10-29 DIAGNOSIS — I2692 Saddle embolus of pulmonary artery without acute cor pulmonale: Secondary | ICD-10-CM | POA: Diagnosis present

## 2015-10-29 DIAGNOSIS — I2699 Other pulmonary embolism without acute cor pulmonale: Secondary | ICD-10-CM | POA: Diagnosis present

## 2015-10-29 DIAGNOSIS — R0781 Pleurodynia: Secondary | ICD-10-CM | POA: Diagnosis present

## 2015-10-29 LAB — HEPARIN LEVEL (UNFRACTIONATED)

## 2015-10-29 LAB — CBC
HEMATOCRIT: 36.3 % (ref 36.0–46.0)
HEMOGLOBIN: 12.1 g/dL (ref 12.0–15.0)
MCH: 30.1 pg (ref 26.0–34.0)
MCHC: 33.3 g/dL (ref 30.0–36.0)
MCV: 90.3 fL (ref 78.0–100.0)
Platelets: 209 10*3/uL (ref 150–400)
RBC: 4.02 MIL/uL (ref 3.87–5.11)
RDW: 12.4 % (ref 11.5–15.5)
WBC: 9.2 10*3/uL (ref 4.0–10.5)

## 2015-10-29 IMAGING — CR DG CHEST 1V PORT
1 series · 1 of 1 positions shown · non-contrast
Comparison: CT [DATE].  Chest x-ray [DATE].

CLINICAL DATA: Pleuritic chest pain.

EXAM:
PORTABLE CHEST 1 VIEW

[AP]
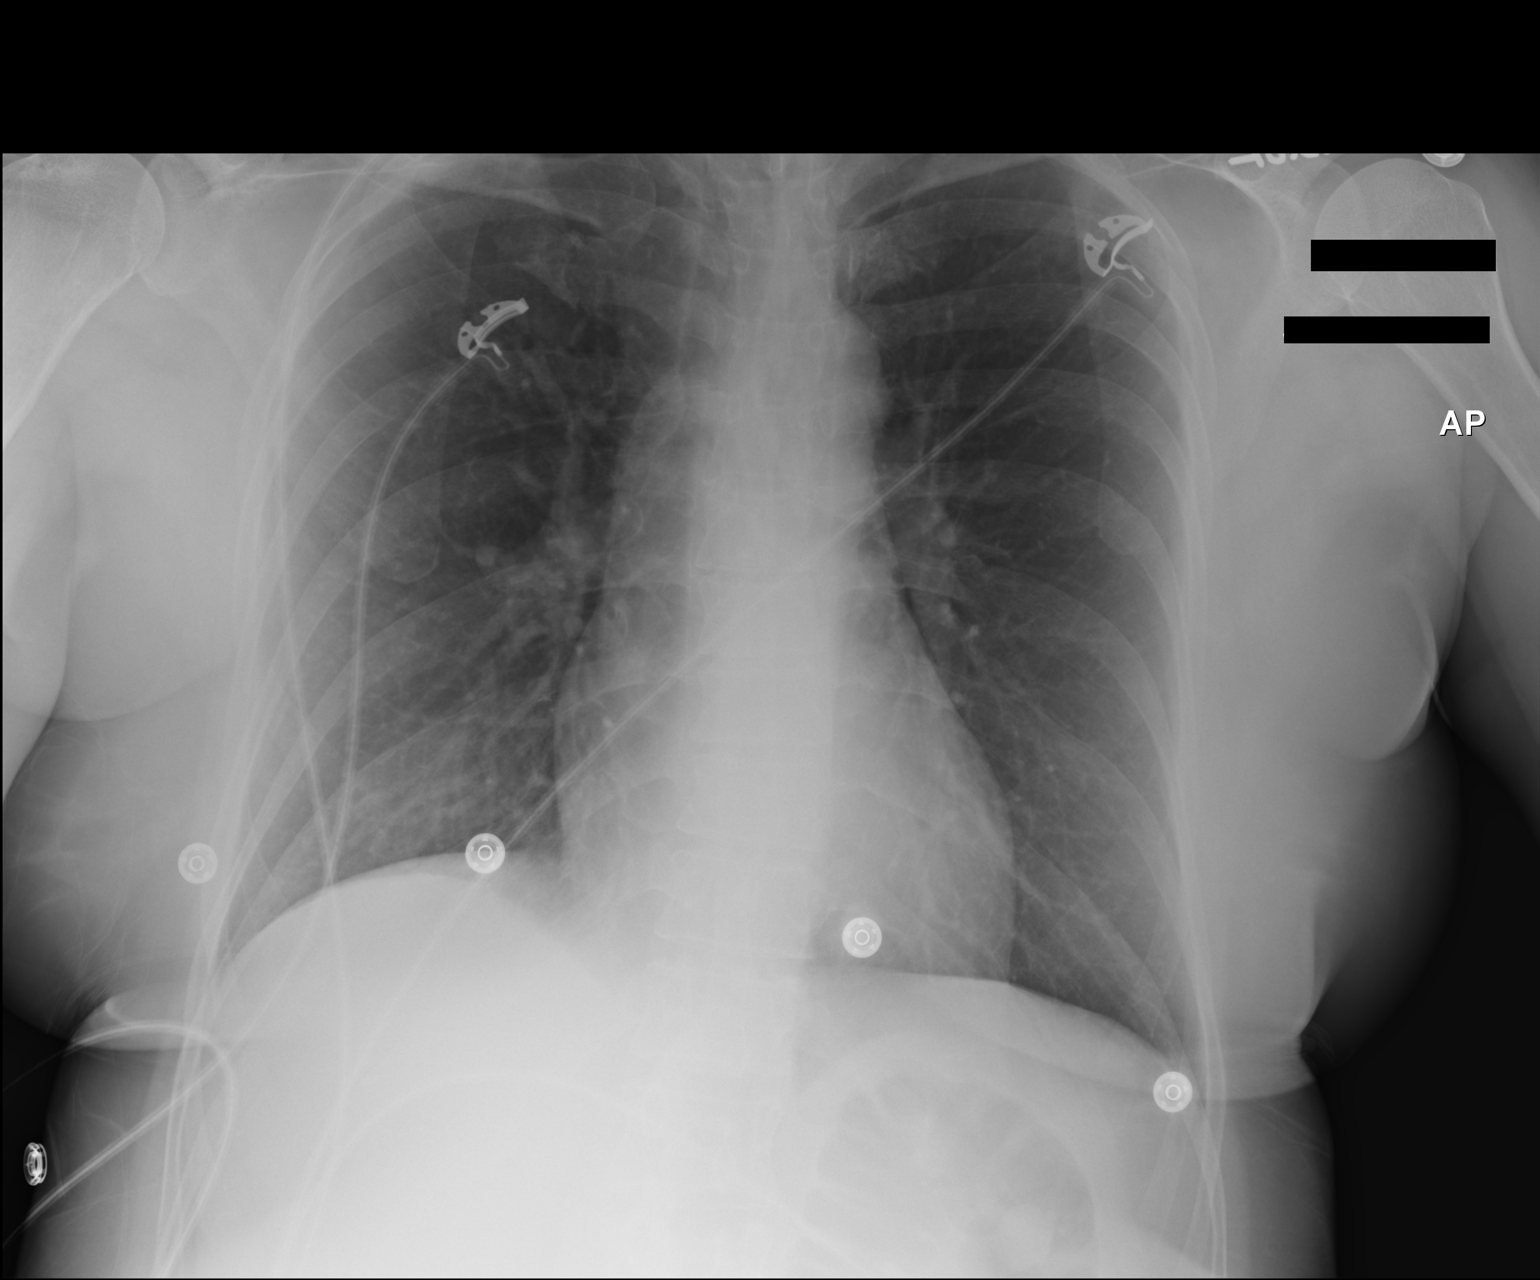

[1 of 1 positions shown; findings below may reference images not displayed]

FINDINGS: Mediastinum hilar structures normal. Mild right base subsegmental
atelectasis. Heart size normal. No pleural effusion or pneumothorax.
Mild bilateral apical pleural thickening noted most consistent
scarring.
IMPRESSION: Mild right base subsegmental atelectasis. Chest is otherwise
negative .

## 2015-10-29 NOTE — Evaluation (Signed)
Physical Therapy Evaluation Patient Details Name: Carolyn LatinChristina D Ross MRN: [id number] DOB: 08/29/1970 Today's Date: 10/29/2015   History of Present Illness  Adm with chest pain and SOB. +bil PE's and LLE DVT; pt on heparin and now transitioned to Xarelto PMHx- HTN, Lt bunionectomy 08/2015    Clinical Impression  Pt admitted with above diagnosis. Patient tolerated ambulation with VSS (see below), however did report feeling "off balance" with supine to sit, sit to stand, and turning Rt. Symptoms lasted up to 45 seconds and would pass. Orthostatic BP was negative. Pt currently with functional limitations due to the deficits listed below (see PT Problem List). Further vestibular evaluation after pt completes her lunch (her request as it arrived during session). Pt will benefit from skilled PT to increase their independence and safety with mobility to allow discharge to the venue listed below.    Supine HR 77  BP 108/70  SaO2 100% on room air Sit              77        111/75              98% Stand        82         97/75              100%  During ambulation HR 77, SaO2 99-100, no dyspnea    Follow Up Recommendations Other (comment) (TBA after vestibular evaluation)    Equipment Recommendations  None recommended by PT    Recommendations for Other Services       Precautions / Restrictions Precautions Precautions: None      Mobility  Bed Mobility Overal bed mobility: Independent                Transfers Overall transfer level: Needs assistance Equipment used: None Transfers: Sit to/from Stand Sit to Stand: Min guard         General transfer comment: due to reports of dizziness; noted slight incr sway in static stance when "off balance feeling occurs" (45 second duration)  Ambulation/Gait Ambulation/Gait assistance: Min guard Ambulation Distance (Feet): 70 Feet Assistive device: None Gait Pattern/deviations: Step-through pattern;Decreased stride length Gait velocity:  very slow Gait velocity interpretation: Below normal speed for age/gender General Gait Details: cautious gait due to recent foot surgery (pt reports due to come out of boot tomorrow and decided to walk without her boot). + onset of dizziness with turn to her rt; none to lt; no nystagmus noted  Stairs            Wheelchair Mobility    Modified Rankin (Stroke Patients Only)       Balance Overall balance assessment: Needs assistance Sitting-balance support: No upper extremity supported;Feet unsupported Sitting balance-Leahy Scale: Normal     Standing balance support: No upper extremity supported Standing balance-Leahy Scale: Fair                               Pertinent Vitals/Pain Pain Assessment: No/denies pain    Home Living Family/patient expects to be discharged to:: Private residence     Type of Home: House Home Access: Stairs to enter   Secretary/administratorntrance Stairs-Number of Steps: 2 Home Layout: One level Home Equipment: Crutches (post-op boot for LLE)      Prior Function Level of Independence: Independent with assistive device(s)  Hand Dominance        Extremity/Trunk Assessment   Upper Extremity Assessment: Overall WFL for tasks assessed           Lower Extremity Assessment: Overall WFL for tasks assessed      Cervical / Trunk Assessment: Normal  Communication   Communication: No difficulties  Cognition Arousal/Alertness: Awake/alert Behavior During Therapy: WFL for tasks assessed/performed Overall Cognitive Status: Within Functional Limits for tasks assessed                      General Comments General comments (skin integrity, edema, etc.): Lunch arrived shortly after PT session initiated. Patient eager to eat "real food while it's hot" therefore deferred Vestibular Evaluation to allow pt to eat (after mobility completed)    Exercises     Assessment/Plan    PT Assessment Patient needs continued PT  services  PT Problem List Decreased balance;Decreased mobility;Other (comment) (?vestibular deficit)          PT Treatment Interventions DME instruction;Gait training;Functional mobility training;Therapeutic activities;Therapeutic exercise;Balance training;Patient/family education    PT Goals (Current goals can be found in the Care Plan section)  Acute Rehab PT Goals Patient Stated Goal: hopeful home tomorrow PT Goal Formulation: With patient Time For Goal Achievement: 11/01/15 Potential to Achieve Goals: Good    Frequency Min 3X/week   Barriers to discharge        Co-evaluation               End of Session Equipment Utilized During Treatment: Gait belt Activity Tolerance: Patient tolerated treatment well (despite dizziness) Patient left: in chair;with call bell/phone within reach;with family/visitor present Nurse Communication: Mobility status         Time: 1610-9604 PT Time Calculation (min) (ACUTE ONLY): 19 min   Charges:   PT Evaluation $PT Eval Low Complexity: 1 Procedure     PT G Codes:        [name] 2015/11/20, 2:00 PM Pager [phone]

## 2015-10-29 NOTE — Progress Notes (Signed)
[name] is a 45 y.o. female patient who transferred  from [facility] awake, alert  & orientated  X 3, Full Code, VSS - Blood pressure 107/68, pulse 69, temperature 97.6 F (36.4 C), temperature source Oral, resp. rate 20, height 5\' 5"  (1.651 m), weight 76.5 kg (168 lb 11.2 oz), last menstrual period 10/17/2015, SpO2 99 %., R/A, no c/o shortness of breath, no c/o chest pain, no distress noted. Non Tele.   IV site WDL: antecubital left, condition patent and no redness with a transparent dsg that's clean dry and intact.  Allergies:   Allergies  Allergen Reactions  . Sulfa Antibiotics Hives     Past Medical History:  Diagnosis Date  . Headache   . Hypertension   . PE (pulmonary embolism) 10/2015  . Thyroid disease    hypothyroidism    Pt orientation to unit, room and routine. SR up x 2, fall risk assessment complete with Patient and family verbalizing understanding of risks associated with falls. Pt verbalizes an understanding of how to use the call bell and to call for help before getting out of bed.  Skin, clean-dry- intact without evidence of bruising, or skin tears.   No evidence of skin break down noted on exam. no rashes    Will cont to monitor and assist as needed.  Joana ReamerJohnson, Keniah Klemmer C, RN 10/29/2015 5:30 PM

## 2015-10-29 NOTE — Discharge Instructions (Addendum)
Information on my medicine - XARELTO (rivaroxaban)  This medication education was reviewed with me or my healthcare representative as part of my discharge preparation.  The pharmacist that spoke with me during my hospital stay was:  Lennon Alstrom, [facility]  WHY WAS [name] PRESCRIBED FOR YOU? Xarelto was prescribed to treat blood clots that may have been found in the veins of your legs (deep vein thrombosis) or in your lungs (pulmonary embolism) and to reduce the risk of them occurring again.  What do you need to know about Xarelto? The starting dose is one 15 mg tablet taken TWICE daily with food for the FIRST 21 DAYS then on (enter date)  11/19/15  the dose is changed to one 20 mg tablet taken ONCE A DAY with your evening meal.  DO NOT stop taking Xarelto without talking to the health care provider who prescribed the medication.  Refill your prescription for 20 mg tablets before you run out.  After discharge, you should have regular check-up appointments with your healthcare provider that is prescribing your Xarelto.  In the future your dose may need to be changed if your kidney function changes by a significant amount.  What do you do if you miss a dose? If you are taking Xarelto TWICE DAILY and you miss a dose, take it as soon as you remember. You may take two 15 mg tablets (total 30 mg) at the same time then resume your regularly scheduled 15 mg twice daily the next day.  If you are taking Xarelto ONCE DAILY and you miss a dose, take it as soon as you remember on the same day then continue your regularly scheduled once daily regimen the next day. Do not take two doses of Xarelto at the same time.   Important Safety Information Xarelto is a blood thinner medicine that can cause bleeding. You should call your healthcare provider right away if you experience any of the following: ? Bleeding from an injury or your nose that does not stop. ? Unusual colored urine (red or dark brown) or  unusual colored stools (red or black). ? Unusual bruising for unknown reasons. ? A serious fall or if you hit your head (even if there is no bleeding).  Some medicines may interact with Xarelto and might increase your risk of bleeding while on Xarelto. To help avoid this, consult your healthcare provider or pharmacist prior to using any new prescription or non-prescription medications, including herbals, vitamins, non-steroidal anti-inflammatory drugs (NSAIDs) and supplements.  This website has more information on Xarelto: VisitDestination.com.br.   Pulmonary Embolism A pulmonary embolism (PE) is a sudden blockage or decrease of blood flow in one lung or both lungs. Most blockages come from a blood clot that travels from the legs or the pelvis to the lungs. PE is a dangerous and potentially life-threatening condition if it is not treated right away. CAUSES A pulmonary embolism occurs most commonly when a blood clot travels from one of your veins to your lungs. Rarely, PE is caused by air, fat, amniotic fluid, or part of a tumor traveling through your veins to your lungs. RISK FACTORS A PE is more likely to develop in:  People who smoke.  People who areolder, especially over 27 years of age.  People who are overweight (obese).  People who sit or lie still for a long time, such as during long-distance travel (over 4 hours), bed rest, hospitalization, or during recovery from certain medical conditions like a stroke.  People who  do not engage in much physical activity (sedentary lifestyle).  People who have chronic breathing disorders.  People whohave a personal or family history of blood clots or blood clotting disease.  People whohave peripheral vascular disease (PVD), diabetes, or some types of cancer.  People who haveheart disease, especially if the person had a recent heart attack or has congestive heart failure.  People who have neurological diseases that affect the legs (leg  paresis).  People who have had a traumatic injury, such as breaking a hip or leg.  People whohave recently had major or lengthy surgery, especially on the hip, knee, or abdomen.  People who have hada central line placed inside a large vein.  People who takemedicines that contain the hormone estrogen. These include birth control pills and hormone replacement therapy.  Pregnancy or during childbirth or the postpartum period. SIGNS AND SYMPTOMS  The symptoms of a PE usually start suddenly and include:  Shortness of breath while active or at rest.  Coughing or coughing up blood or blood-tinged mucus.  Chest pain that is often worse with deep breaths.  Rapid or irregular heartbeat.  Feeling light-headed or dizzy.  Fainting.  Feelinganxious.  Sweating. There may also be pain and swelling in a leg if that is where the blood clot started. These symptoms may represent a serious problem that is an emergency. Do not wait to see if the symptoms will go away. Get medical help right away. Call your local emergency services (911 in the [place] Do not drive yourself to the hospital. DIAGNOSIS Your health care provider will take a medical history and perform a physical exam. You may also have other tests, including:  Blood tests to assess the clotting properties of your blood, assess oxygen levels in your blood, and find blood clots.  Imaging tests, such as CT, ultrasound, MRI, X-ray, and other tests to see if you have clots anywhere in your body.  An electrocardiogram (ECG) to look for heart strain from blood clots in the lungs. TREATMENT The main goals of PE treatment are:  To stop a blood clot from growing larger.  To stop new blood clots from forming. The type of treatment that you receive depends on many factors, such as the cause of your PE, your risk for bleeding or developing more clots, and other medical conditions that you have. Sometimes, a combination of treatments is  necessary. This condition may be treated with:  Medicines, including newer oral blood thinners (anticoagulants), warfarin, low molecular weight heparins, thrombolytics, or heparins.  Wearing compression stockings or using different types of devices.  Surgery (rare) to remove the blood clot or to place a filter in your abdomen to stop the blood clot from traveling to your lungs. Treatments for a PE are often divided into immediate treatment, long-term treatment (up to 3 months after PE), and extended treatment (more than 3 months after PE). Your treatment may continue for several months. This is called maintenance therapy, and it is used to prevent the forming of new blood clots. You can work with your health care provider to choose the treatment program that is best for you. What are anticoagulants? Anticoagulants are medicines that treat PEs. They can stop current blood clots from growing and stop new clots from forming. They cannot dissolve existing clots. Your body dissolves clots by itself over time. Anticoagulants are given by mouth, by injection, or through an IV tube. What are thrombolytics? Thrombolytics are clot-dissolving medicines that are used to dissolve a PE.  They carry a high risk of bleeding, so they tend to be used only in severe cases or if you have very low blood pressure. HOME CARE INSTRUCTIONS If you are taking a newer oral anticoagulant:  Take the medicine every single day at the same time each day.  Understand what foods and drugs interact with this medicine.  Understand that there are no regular blood tests required when using this medicine.  Understandthe side effects of this medicine, including excessive bruising or bleeding. Ask your health care provider or pharmacist about other possible side effects. If you are taking warfarin:  Understand how to take warfarin and know which foods can affect how warfarin works in Public relations account executive.  Understand that it is dangerous  to taketoo much or too little warfarin. Too much warfarin increases the risk of bleeding. Too little warfarin continues to allow the risk for blood clots.  Follow your PT and INR blood testing schedule. The PT and INR results allow your health care provider to adjust your dose of warfarin. It is very important that you have your PT and INR tested as often as told by your health care provider.  Avoid major changes in your diet, or tell your health care provider before you change your diet. Arrange a visit with a registered dietitian to answer your questions. Many foods, especially foods that are high in vitamin K, can interfere with warfarin and affect the PT and INR results. Eat a consistent amount of foods that are high in vitamin K, such as:  Spinach, kale, broccoli, cabbage, collard greens, turnip greens, Brussels sprouts, peas, cauliflower, seaweed, and parsley.  Beef liver and pork liver.  Green tea.  Soybean oil.  Tell your health care provider about any and all medicines, vitamins, and supplements that you take, including aspirin and other over-the-counter anti-inflammatory medicines. Be especially cautious with aspirin and anti-inflammatory medicines. Do not take those before you ask your health care provider if it is safe to do so. This is important because many medicines can interfere with warfarin and affect the PT and INR results.  Do not start or stop taking any over-the-counter or prescription medicine unless your health care provider or pharmacist tells you to do so. If you take warfarin, you will also need to do these things:  Hold pressure over cuts for longer than usual.  Tell your dentist and other health care providers that you are taking warfarin before you have any procedures in which bleeding may occur.  Avoid alcohol or drink very small amounts. Tell your health care provider if you change your alcohol intake.  Do not use tobacco products, including cigarettes,  chewing tobacco, and e-cigarettes. If you need help quitting, ask your health care provider.  Avoid contact sports. General Instructions  Take over-the-counter and prescription medicines only as told by your health care provider. Anticoagulant medicines can have side effects, including easy bruising and difficulty stopping bleeding. If you are prescribed an anticoagulant, you will also need to do these things:  Hold pressure over cuts for longer than usual.  Tell your dentist and other health care providers that you are taking anticoagulants before you have any procedures in which bleeding may occur.  Avoid contact sports.  Wear a medical alert bracelet or carry a medical alert card that says you have had a PE.  Ask your health care provider how soon you can go back to your normal activities. Stay active to prevent new blood clots from forming.  Make sure  to exercise while traveling or when you have been sitting or standing for a long period of time. It is very important to exercise. Exercise your legs by walking or by tightening and relaxing your leg muscles often. Take frequent walks.  Wear compression stockings as told by your health care provider to help prevent more blood clots from forming.  Do not use tobacco products, including cigarettes, chewing tobacco, and e-cigarettes. If you need help quitting, ask your health care provider.  Keep all follow-up appointments with your health care provider. This is important. PREVENTION Take these actions to decrease your risk of developing another PE:  Exercise regularly. For at least 30 minutes every day, engage in:  Activity that involves moving your arms and legs.  Activity that encourages good blood flow through your body by increasing your heart rate.  Exercise your arms and legs every hour during long-distance travel (over 4 hours). Drink plenty of water and avoid drinking alcohol while traveling.  Avoid sitting or lying in bed  for long periods of time without moving your legs.  Maintain a weight that is appropriate for your height. Ask your health care provider what weight is healthy for you.  If you are a woman who is over 68 years of age, avoid unnecessary use of medicines that contain estrogen. These include birth control pills.  Do not smoke, especially if you take estrogen medicines. If you need help quitting, ask your health care provider.  If you are at very high risk for PE, wear compression stockings.  If you recently had a PE, have regularly scheduled ultrasound testing on your legs to check for new blood clots. If you are hospitalized, prevention measures may include:  Early walking after surgery, as soon as your health care provider says that it is safe.  Receiving anticoagulants to prevent blood clots. If you cannot take anticoagulants, other options may be available, such as wearing compression stockings or using different types of devices. SEEK IMMEDIATE MEDICAL CARE IF:  You have new or increased pain, swelling, or redness in an arm or leg.  You have numbness or tingling in an arm or leg.  You have shortness of breath while active or at rest.  You have chest pain.  You have a rapid or irregular heartbeat.  You feel light-headed or dizzy.  You cough up blood.  You notice blood in your vomit, bowel movement, or urine.  You have a fever. These symptoms may represent a serious problem that is an emergency. Do not wait to see if the symptoms will go away. Get medical help right away. Call your local emergency services (911 in the [place] Do not drive yourself to the hospital.   This information is not intended to replace advice given to you by your health care provider. Make sure you discuss any questions you have with your health care provider.   Document Released: 01/24/2000 Document Revised: 10/17/2014 Document Reviewed: 05/23/2014 Elsevier Interactive Patient Education 2016 [employer].  Pain Medicine Instructions HOW CAN PAIN MEDICINE AFFECT ME?  You were given a prescription for pain medicine. This medicine may make you tired or drowsy and may affect your ability to think clearly. Pain medicine may also affect your ability to drive or perform certain physical activities. It may not be possible to make all of your pain go away, but you should be comfortable enough to move, breathe, and take care of yourself. HOW OFTEN SHOULD I TAKE PAIN MEDICINE AND HOW [facility] SHOULD  I TAKE?  Take pain medicine only as directed by your health care provider and only as needed for pain.  You do not need to take pain medicine if you are not having pain, unless directed by your health care provider.  You can take less than the prescribed dose if you find that a smaller amount of medicine controls your pain. WHAT RESTRICTIONS DO I HAVE WHILE TAKING PAIN MEDICINE? Follow these instructions after you start taking pain medicine, while you are taking the medicine, and for 8 hours after you stop taking the medicine:  Do not drive.  Do not operate machinery.  Do not operate power tools.  Do not sign legal documents.  Do not drink alcohol.  Do not take sleeping pills.  Do not supervise children by yourself.  Do not participate in activities that require climbing or being in high places.  Do not enter a body of water--such as a lake, river, ocean, spa, or swimming pool--without an adult nearby who can monitor and help you. HOW CAN I KEEP OTHERS SAFE WHILE I AM TAKING PAIN MEDICINE?  Store your pain medicine as directed by your health care provider. Make sure that it is placed where children and pets cannot reach it.  Never share your pain medicine with anyone.  Do not save any leftover pills. If you have any leftover pain medicine, get rid of it or destroy it as directed by your health care provider. WHAT ELSE DO I NEED TO KNOW ABOUT TAKING PAIN MEDICINE?  Use a stool softener if  you become constipated from your pain medicine. Increasing your intake of fruits and vegetables will also help with constipation.  Write down the times when you take your pain medicine. Look at the times before you take your next dose of medicine. It is easy to become confused while on pain medicine. Recording the times helps you to avoid an overdose.  If your pain is severe, do not try to treat it yourself by taking more pills than instructed on your prescription. Contact your health care provider for help.  You may have been prescribed a pain medicine that contains acetaminophen. Do not take any other acetaminophen while taking this medicine. An overdose of acetaminophen can result in severe liver damage. Acetaminophen is found in many over-the-counter (OTC) and prescription medicines. If you are taking any medicines in addition to your pain medicine, check the active ingredients on those medicines to see if acetaminophen is listed. WHEN SHOULD I CALL MY HEALTH CARE PROVIDER?  Your medicine is not helping to make the pain go away.  You vomit or have diarrhea shortly after taking the medicine.  You develop new pain in areas that did not hurt before.  You have an allergic reaction to your medicine. This may include: ? Itchiness. ? Swelling. ? Dizziness. ? Developing a new rash. WHEN SHOULD I CALL 911 OR GO TO THE EMERGENCY ROOM?  You feel dizzy or you faint.  You are very confused or disoriented.  You repeatedly vomit.  Your skin or lips turn pale or bluish in color.  You have shortness of breath or you are breathing much more slowly than usual.  You have a severe allergic reaction to your medicine. This includes: ? Developing tongue swelling. ? Having difficulty breathing.  This information is not intended to replace advice given to you by your health care provider. Make sure you discuss any questions you have with your health care provider.  Document Released: 05/04/2000  Document Revised: 06/12/2014 Document Reviewed: 11/30/2013 Elsevier Interactive Patient Education [employer] Elsevier Inc.

## 2015-10-29 NOTE — Progress Notes (Signed)
Physical Therapy Treatment Patient Details Name: [name] MRN: [id number] DOB: 10/12/1970 Today's Date: 10/29/2015    History of Present Illness Adm with chest pain and SOB. +bil PE's and LLE DVT; pt on heparin and now transitioned to Xarelto PMHx- HTN, Lt bunionectomy 08/2015    PT Comments    See also Vestibular assessment below. Patient +for Rt horizontal canal BPPV and performed cannalith repositioning maneuver for this. PT to reassess 9/20 and perform maneuver again as needed.   Follow Up Recommendations  Outpatient PT (Vestibular Rehab)     Equipment Recommendations  None recommended by PT    10/29/15 1455  Vestibular Assessment  General Observation Reports feeling "off balance" demonstrates lean to the right that lasts seconds with supine to sit, sit to stand  Symptom Behavior  Type of Dizziness Imbalance  Frequency of Dizziness Each time she does the movements  Duration of Dizziness <45 sec  Aggravating Factors Supine to sit;Sit to stand (Standing turn to right)  Occulomotor Exam  Occulomotor Alignment Normal  Spontaneous Absent  Gaze-induced Absent  Smooth Pursuits Intact  Vestibulo-Occular Reflex  VOR Cancellation Normal  Positional Testing  Dix-Hallpike Dix-Hallpike Right;Dix-Hallpike Left  Horizontal Canal Testing Horizontal Canal Right;Horizontal Canal Left;Horizontal Canal Right Intensity;Horizontal Canal Left Intensity  Dix-Hallpike Right  Dix-Hallpike Right Duration 0  Dix-Hallpike Right Symptoms No nystagmus  Dix-Hallpike Left  Dix-Hallpike Left Duration 0  Dix-Hallpike Left Symptoms No nystagmus  Horizontal Canal Right  Horizontal Canal Right Duration 2 seconds  Horizontal Canal Right Symptoms Geotrophic  Horizontal Canal Left  Horizontal Canal Left Duration 0  Horizontal Canal Left Symptoms Normal  Cognition  Cognition Orientation Level Appropriate for developmental age  Orthostatics  Orthostatics Comment see PT eval; normal      Recommendations for Other Services       Precautions / Restrictions Precautions Precautions: None    Mobility  Bed Mobility Overal bed mobility: Independent             General bed mobility comments: assisted with movements for cannalith repositioning  Transfers Overall transfer level: Needs assistance Equipment used: None Transfers: Stand Pivot Transfers Sit to Stand: Min guard Stand pivot transfers: Min guard       General transfer comment: assisted to bathroom prior to cannalith repositioning with dizziness upon standing each time  Ambulation/Gait Ambulation/Gait assistance: Min guard Ambulation Distance (Feet): 70 Feet Assistive device: None Gait Pattern/deviations: Step-through pattern;Decreased stride length Gait velocity: very slow Gait velocity interpretation: Below normal speed for age/gender General Gait Details: deferred; pt being transferred to new room (RN aware to use wheelchair and pt not to lie down prior to 4:00pm)   Information systems managertairs            Wheelchair Mobility    Modified Rankin (Stroke Patients Only)       Balance Overall balance assessment: Needs assistance Sitting-balance support: No upper extremity supported;Feet unsupported Sitting balance-Leahy Scale: Normal     Standing balance support: No upper extremity supported Standing balance-Leahy Scale: Fair                      Cognition Arousal/Alertness: Awake/alert Behavior During Therapy: WFL for tasks assessed/performed Overall Cognitive Status: Within Functional Limits for tasks assessed                      Exercises      General Comments General comments (skin integrity, edema, etc.): After positive Rt horizontal canal testing, educated patient on BPPV  and techniques to "move" the crystals. Performed Appiani canalith repositioning maneuver for Rt ear. Patient asymptomatic during maneuver. Educated on restrictions to head movements (looking up or down to  floor).      Pertinent Vitals/Pain Pain Assessment: No/denies pain    Home Living Family/patient expects to be discharged to:: Private residence     Type of Home: House Home Access: Stairs to enter   Home Layout: One level Home Equipment: Crutches (post-op boot for LLE)      Prior Function Level of Independence: Independent with assistive device(s)          PT Goals (current goals can now be found in the care plan section) Acute Rehab PT Goals Patient Stated Goal: hopeful home tomorrow PT Goal Formulation: With patient Time For Goal Achievement: 11/01/15 Potential to Achieve Goals: Good Progress towards PT goals: Progressing toward goals    Frequency    Min 3X/week      PT Plan Discharge plan needs to be updated    Co-evaluation             End of Session Equipment Utilized During Treatment: Gait belt Activity Tolerance: Patient tolerated treatment well Patient left: in chair;with call bell/phone within reach;with family/visitor present     Time: 1610-9604 PT Time Calculation (min) (ACUTE ONLY): 42 min  Charges:  $Therapeutic Activity: 8-22 mins $Self Care/Home Management: 8-22 $Canalith Rep Proc: 8-22 mins                    G Codes:      [name] 26-Nov-2015, 3:09 PM Pager [phone]

## 2015-10-29 NOTE — Progress Notes (Signed)
PROGRESS NOTE    [name]  ZOX:[id number] DOB: May 06, 1970 DOA: 10/24/2015 PCP: Katy Apo, MD   Brief Narrative:  45 y.o.WF PMHx Hypothyroidism, HTNwho presented with sudden onset pleuritic chest pain and SOB.  In the ED a CTa of the chest showed bilateral PEs.  Of note, she had a bunionectomy on her left foot in July, has been in a boot since then, noting some intermittent mild left foot swelling from time to time.    Subjective: 9/19 A/O 4, NAD. Patient has not ambulated yet.   Assessment & Plan:   Principal Problem:   Acute saddle pulmonary embolism with acute cor pulmonale (HCC) Active Problems:   Essential hypertension   Hypothyroidism, acquired   Acute saddle PE, submassive - Mod R heart strain  -PCCM has evaluated -TTE noted moderate RV strain/R heart failure but not to extent further intervention indicated  -discussed pros/cons of all oral antocoag options at length w/ pt and her mother - she has chosen Xarelto - to transition today - case Production designer, theatre/television/film confirmed this is covered by her insurance company with a 20% co-pay - case manager to provide with 30 day free card and possible card to qualify for discounted co-pay -Begin to ambulate -Check oxygen saturations with ambulation -Given extensive clot burden in very serious nature of this episode the patient and I have decided to commit to 6 months of full anti-coagulation with reevaluation at the end of that treatment course -Ambulatory SPO2 pending.  Severe pleuritic right-sided chest pain -due to above - follow-up chest x-ray in a.m. - much improved with scheduled Toradol for 5 day course  Acute extensive DVT LLE  -no evidence of mobile clot as per prelim report - swelling in leg essentially resolved  Hypothyroidism -Continue levothyroxine  HTN -BP stabilizing - slowly increase BB back to her usual home dose   Minimal dysfunctional uterine bleeding -Not problematic at present but will need to  follow - if worsens may require gynecologic evaluation - patient informs me she has annual Pap smears and these have always been normal - I have counseled her on the advisability of discontinuing hormone based contraception, especially once she is transitioned off anticoagulation   DVT prophylaxis: IV heparin > Xarelto Code Status: Full Family Communication: Family present Disposition Plan: Discharge on 9/20 if continues to be stable overnight. Ensure ambulatory SPO2 accomplished Home O2 requirement?  Consultants:  PCCM    Procedures/Significant Events:  9/15 - venous duplex - extensive L LE DVT 9/15 TTE - EF 60-65 % - wall motion normal with no regional wall motion of the modalities - grade 1 diastolic dysfunction - RV moderately dilated with moderate systolic function reduction  Cultures None  Antimicrobials: None   Devices None   LINES / TUBES:  None    Continuous Infusions:    Objective: Vitals:   10/29/15 0323 10/29/15 0803 10/29/15 0855 10/29/15 0900  BP: 109/76 104/64  104/64  Pulse: 79 71 76 74  Resp: 13 14 19    Temp: 97.7 F (36.5 C) 98 F (36.7 C)    TempSrc: Oral Oral    SpO2: 100% 100% 100%   Weight:      Height:        Intake/Output Summary (Last 24 hours) at 10/29/15 1011 Last data filed at 10/29/15 0321  Gross per 24 hour  Intake              240 ml  Output  2 ml  Net              238 ml   Filed Weights   10/24/15 2223 10/25/15 1335  Weight: 76.7 kg (169 lb 1.5 oz) 70.2 kg (154 lb 12.2 oz)    Examination:  General: A/O 4, NAD, No acute respiratory distress Eyes: negative scleral hemorrhage, negative anisocoria, negative icterus ENT: Negative Runny nose, negative gingival bleeding, Neck:  Negative scars, masses, torticollis, lymphadenopathy, JVD Lungs: Clear to auscultation bilaterally without wheezes or crackles Cardiovascular: Regular rate and rhythm without murmur gallop or rub normal S1 and S2 Abdomen: negative  abdominal pain, nondistended, positive soft, bowel sounds, no rebound, no ascites, no appreciable mass Extremities: No significant cyanosis, clubbing, or edema bilateral lower extremities Skin: Negative rashes, lesions, ulcers Psychiatric:  Negative depression, negative anxiety, negative fatigue, negative mania  Central nervous system:  Cranial nerves II through XII intact, tongue/uvula midline, all extremities muscle strength 5/5, sensation intact throughout,negative dysarthria, negative expressive aphasia, negative receptive aphasia.  .     Data Reviewed: Care during the described time interval was provided by me .  I have reviewed this patient's available data, including medical history, events of note, physical examination, and all test results as part of my evaluation. I have personally reviewed and interpreted all radiology studies.  CBC:  Recent Labs Lab 10/25/15 0604 10/26/15 0411 10/27/15 0428 10/28/15 0324 10/29/15 0302  WBC 13.4* 10.7* 10.1 10.6* 9.2  HGB 13.3 13.3 12.9 12.2 12.1  HCT 39.4 40.3 39.0 36.7 36.3  MCV 91.2 91.8 91.1 90.2 90.3  PLT 166 178 177 181 209   Basic Metabolic Panel:  Recent Labs Lab 10/24/15 1856 10/25/15 0604 10/26/15 0411  NA 136 137 137  K 4.4 3.9 3.9  CL 106 108 106  CO2 22 20* 22  GLUCOSE 138* 103* 104*  BUN 12 10 11   CREATININE 0.93 0.83 0.88  CALCIUM 9.6 9.3 9.1   GFR: Estimated Creatinine Clearance: 78.6 mL/min (by C-G formula based on SCr of 0.88 mg/dL). Liver Function Tests:  Recent Labs Lab 10/25/15 0604 10/26/15 0411  AST 22 18  ALT 19 21  ALKPHOS 44 41  BILITOT 0.8 0.5  PROT 6.7 7.0  ALBUMIN 3.4* 3.1*   No results for input(s): LIPASE, AMYLASE in the last 168 hours. No results for input(s): AMMONIA in the last 168 hours. Coagulation Profile: No results for input(s): INR, PROTIME in the last 168 hours. Cardiac Enzymes:  Recent Labs Lab 10/25/15 0604  TROPONINI 0.31*   BNP (last 3 results) No results for  input(s): PROBNP in the last 8760 hours. HbA1C: No results for input(s): HGBA1C in the last 72 hours. CBG: No results for input(s): GLUCAP in the last 168 hours. Lipid Profile: No results for input(s): CHOL, HDL, LDLCALC, TRIG, CHOLHDL, LDLDIRECT in the last 72 hours. Thyroid Function Tests: No results for input(s): TSH, T4TOTAL, FREET4, T3FREE, THYROIDAB in the last 72 hours. Anemia Panel: No results for input(s): VITAMINB12, FOLATE, FERRITIN, TIBC, IRON, RETICCTPCT in the last 72 hours. Urine analysis: No results found for: COLORURINE, APPEARANCEUR, LABSPEC, PHURINE, GLUCOSEU, HGBUR, BILIRUBINUR, KETONESUR, PROTEINUR, UROBILINOGEN, NITRITE, LEUKOCYTESUR Sepsis Labs: @LABRCNTIP (procalcitonin:4,lacticidven:4)  ) Recent Results (from the past 240 hour(s))  MRSA PCR Screening     Status: None   Collection Time: 10/25/15  1:48 PM  Result Value Ref Range Status   MRSA by PCR NEGATIVE NEGATIVE Final    Comment:        The GeneXpert MRSA Assay (FDA approved for  NASAL specimens only), is one component of a comprehensive MRSA colonization surveillance program. It is not intended to diagnose MRSA infection nor to guide or monitor treatment for MRSA infections.          Radiology Studies: Dg Chest Port 1 View  Result Date: 10/29/2015 CLINICAL DATA:  Pleuritic chest pain. EXAM: PORTABLE CHEST 1 VIEW COMPARISON:  CT 10/24/2015.  Chest x-ray 10/24/2015. FINDINGS: Mediastinum hilar structures normal. Mild right base subsegmental atelectasis. Heart size normal. No pleural effusion or pneumothorax. Mild bilateral apical pleural thickening noted most consistent scarring. IMPRESSION: Mild right base subsegmental atelectasis. Chest is otherwise negative . Electronically Signed   By: Maisie Fushomas  Register   On: 10/29/2015 07:26        Scheduled Meds: . atenolol  50 mg Oral Daily  . ketorolac  30 mg Intravenous Q6H  . levothyroxine  175 mcg Oral QAC breakfast  . Rivaroxaban  15 mg Oral BID  WC  . [START ON 11/19/2015] rivaroxaban  20 mg Oral Q supper   Continuous Infusions:    LOS: 5 days    Time spent: 40 minutes    Missie Gehrig, Roselind MessierURTIS J, MD [facility] Pager [phone]   If 7PM-7AM, please contact night-coverage [email or web address] Password [email or web address] 10/29/2015, 10:11 AM

## 2015-10-30 DIAGNOSIS — I2692 Saddle embolus of pulmonary artery without acute cor pulmonale: Secondary | ICD-10-CM

## 2015-10-30 DIAGNOSIS — R0781 Pleurodynia: Secondary | ICD-10-CM

## 2015-10-30 MED ORDER — RIVAROXABAN (XARELTO) VTE STARTER PACK (15 & 20 MG): ORAL_TABLET | ORAL | 0 refills | Status: DC

## 2015-10-30 MED ORDER — OXYCODONE-ACETAMINOPHEN 5-325 MG PO TABS: 1.0000 | ORAL_TABLET | Freq: Four times a day (QID) | ORAL | 0 refills | Status: DC | PRN

## 2015-10-30 MED ORDER — OXYCODONE-ACETAMINOPHEN 5-325 MG PO TABS
1.0000 | ORAL_TABLET | ORAL | Status: DC | PRN
  Administered 2015-10-30 (×2): 1 via ORAL
  Filled 2015-10-30 (×2): qty 1

## 2015-10-30 NOTE — Progress Notes (Signed)
Physical Therapy Treatment Patient Details Name: Carolyn LatinChristina D Ross MRN: [id number] DOB: 09/24/1970 Today's Date: 10/30/2015    History of Present Illness Adm with chest pain and SOB. +bil PE's and LLE DVT; pt on heparin and now transitioned to Xarelto PMHx- HTN, Lt bunionectomy 08/2015    PT Comments    Patient reports much improved and no further signs of BPPV.  Encouraged to keep referral for outpatient in case it recurs, but could decline/cancel if not needed.  Feel continued use of single crutch indicated till more used to walking without boot and educated pt. Safe for d/c home when stable.  Follow Up Recommendations  Outpatient PT (vestibular rehab referral)     Equipment Recommendations  None recommended by PT    Recommendations for Other Services       Precautions / Restrictions Precautions Precautions: None    Mobility  Bed Mobility Overal bed mobility: Independent             General bed mobility comments: sit<>supine for BPPV testing independent  Transfers Overall transfer level: Modified independent   Transfers: Sit to/from Stand Sit to Stand: Modified independent (Device/Increase time)         General transfer comment: up in bathroom initially getting ready to shower, but returned to bed to check for residual BPPV, then got back up independently  Ambulation/Gait Ambulation/Gait assistance: Supervision;Min guard Ambulation Distance (Feet): 250 Feet Assistive device: None Gait Pattern/deviations: Step-through pattern;Decreased stride length     General Gait Details: walking on outside for L foot noted edema, pt reports trying to make herself walk on great toe   Stairs            Wheelchair Mobility    Modified Rankin (Stroke Patients Only)       Balance Overall balance assessment: Needs assistance           Standing balance-Leahy Scale: Good Standing balance comment: walking in room unaided, some support in hallway due to L foot  pain                    Cognition Arousal/Alertness: Awake/alert Behavior During Therapy: WFL for tasks assessed/performed Overall Cognitive Status: Within Functional Limits for tasks assessed                      Exercises      General Comments General comments (skin integrity, edema, etc.): supine head roll R and L negative for symptoms or nystagmus; patient ambulated on RA with SpO2 maintained 95 or above      Pertinent Vitals/Pain Pain Assessment: Faces Faces Pain Scale: Hurts a little bit Pain Location: R foot with ambulation Pain Descriptors / Indicators: Discomfort Pain Intervention(s): Monitored during session    Home Living                      Prior Function            PT Goals (current goals can now be found in the care plan section) Progress towards PT goals: Progressing toward goals    Frequency    Min 3X/week      PT Plan Current plan remains appropriate    Co-evaluation             End of Session   Activity Tolerance: Patient tolerated treatment well Patient left: with family/visitor present;Other (comment);with nursing/sitter in room (in bathroom for shower)     Time: [id number] PT Time Calculation (min) (ACUTE  ONLY): 22 min  Charges:  $Gait Training: 8-22 mins                    G Codes:      Elray Mcgregor 11-20-15, 2:45 PM  [place], [place] [phone]/11/20

## 2015-10-30 NOTE — Discharge Summary (Signed)
Physician Discharge Summary  [name] GYB:[id number] DOB: 05/12/1970  PCP: Kandice Hams, MD  Admit date: 10/24/2015 Discharge date: 10/30/2015  Admitted From: Home Disposition:  Home  Recommendations for Outpatient Follow-up:  1. Dr. Seward Carol, PCP on 11/06/2015 at 10:15 AM 2. Patient has been advised to follow-up with her OB/GYN regarding discussion and decision regarding contraception.  Home Health: Outpatient PT/vestibular therapy. Equipment/Devices: None    Discharge Condition: Improved and stable.  CODE STATUS: Full  Diet recommendation: Heart healthy diet.  Discharge Diagnoses:  Principal Problem:   Acute saddle pulmonary embolism with acute cor pulmonale (HCC) Active Problems:   Essential hypertension   Hypothyroidism, acquired   Pulmonary embolism (HCC)   Acute saddle pulmonary embolism without acute cor pulmonale (HCC)   Pleuritic chest pain   Brief/Interim Summary: 45 y.o.femalewith a history of hypothyroidism and HTNwho presented with sudden onset pleuritic chest pain and SOB.  In the ED a CTa of the chest showed bilateral PEs.  Of note, she had a bunionectomy on her left foot in July, has been in a boot since then, noting some intermittent mild left foot swelling from time to time.  Assessment & Plan:  Acute saddle PE, submassive - Mod R heart strain  -PCCM has evaluated -TTE noted moderate RV strain/R heart failure but not to extent further intervention indicated  - On 9/18, MD discussed pros/cons of all oral antocoag options at length w/ pt and her mother - she has chosen Xarelto - transitioned on 10/28/2015 (completed 4 doses thus far) - case manager confirmed this is covered by her insurance company with a 20% co-pay - case manager to provide with 30 day free card and possible card to qualify for discounted co-pay - Patient has ambulated today and is saturating in the high 90s on room air. Dyspnea and chest pain have improved.  -Given  extensive clot burden in very serious nature of this episode the patient was recommended 6 months of full anti-coagulation with reevaluation at the end of that treatment course - Patient was also on vaginal contraception-currently held until outpatient follow-up with GYN. This may also have increased her thromboembolic risk. - Clinically improved and hemodynamically stable.  Severe pleuritic right-sided chest pain -due to above. Repeat chest x-ray 9/19 shows mild right base subsegmental atelectasis but otherwise negative. - She had been on IV Toradol for the last 4 days with improved pain. Reluctant to discharge on NSAIDs due to bleeding risks while on full anticoagulation. She has tried Percocet in the past with good effect. Hence switched to oral Percocet and she indicates that her pain is adequately controlled. She will be discharged from a short supply to be used as needed.  Acute extensive DVT LLE  -no evidence of mobile clot as per prelim report - swelling in leg essentially resolved. Anticoagulation management as above.  Hypothyroidism -Continue levothyroxine  HTN - Occasional soft blood pressures with SBP in the mid 90s but asymptomatic. Advised patient to monitor for symptoms and with BP checks if possible at home. Outpatient follow-up with PCP.  Minimal dysfunctional uterine bleeding -Not problematic at present but will need to follow - if worsens may require gynecologic evaluation - patient informs me she has annual Pap smears and these have always been normal - MD counseled her on the advisability of discontinuing hormone based contraception, especially once she is transitioned off anticoagulation - No vaginal bleeding reported today. Recommended outpatient follow-up with GYN.  Vertigo/? BPPV - Patient developed transient vertigo on  9/19. PT evaluated and positive for right horizontal canal BPPV and underwent treatment with resolution of symptoms and has not  recurred.   Discharge Instructions  Discharge Instructions    Ambulatory referral to Physical Therapy    Complete by:  As directed    EVAL. AND TREATMENT FOR VESTIBULAR THERAPY   Call MD for:  difficulty breathing, headache or visual disturbances    Complete by:  As directed    Call MD for:  extreme fatigue    Complete by:  As directed    Call MD for:  persistant dizziness or light-headedness    Complete by:  As directed    Call MD for:  persistant nausea and vomiting    Complete by:  As directed    Call MD for:  severe uncontrolled pain    Complete by:  As directed    Call MD for:  temperature >100.4    Complete by:  As directed    Diet - low sodium heart healthy    Complete by:  As directed    Increase activity slowly    Complete by:  As directed        Medication List    STOP taking these medications   NUVARING 0.12-0.015 MG/24HR vaginal ring Generic drug:  etonogestrel-ethinyl estradiol     TAKE these medications   atenolol 50 MG tablet Commonly known as:  TENORMIN Take 50 mg by mouth daily.   levothyroxine 175 MCG tablet Commonly known as:  SYNTHROID, LEVOTHROID Take 175 mcg by mouth daily before breakfast.   oxyCODONE-acetaminophen 5-325 MG tablet Commonly known as:  PERCOCET/ROXICET Take 1-2 tablets by mouth every 6 (six) hours as needed for moderate pain or severe pain.   Rivaroxaban 15 & 20 MG Tbpk Take as directed on package: Start with one '15mg'$  tablet by mouth twice a day with food. On Day 22, switch to one '20mg'$  tablet once a day with food. Discard first 4 tablets of the 15 mg (you received these in the hospital).   SUMAtriptan 25 MG tablet Commonly known as:  IMITREX Take 25 mg by mouth every 2 (two) hours as needed for migraine.      Follow-up Information    [place] .   Specialty:  Rehabilitation Why:  Office is to call and schedule appointment for your Outpatient Vestibular Therapy, if no call  within 2-3 days please call to schedule appointment. Contact information: [address] [place] [phone] [facility] / Dr. Seward Carol .   Why:  Pt with scheduled annual physical appointment inplace on September 27th at 10:15am, hospital follow up visit will be met at that time. Contact information: [phone]         Allergies  Allergen Reactions  . Sulfa Antibiotics Hives    Consultations:  PCCM   Procedures/Studies: Dg Chest 2 View  Result Date: 10/24/2015 CLINICAL DATA:  Chest pain EXAM: CHEST  2 VIEW COMPARISON:  None. FINDINGS: The heart size and mediastinal contours are within normal limits. Both lungs are clear. The visualized skeletal structures are unremarkable. IMPRESSION: No active cardiopulmonary disease. Electronically Signed   By: Franki Cabot M.D.   On: 10/24/2015 19:34   Ct Angio Chest Pe W And/or Wo Contrast  Result Date: 10/24/2015 CLINICAL DATA:  Chest pain and shortness of breath since yesterday. EXAM: CT ANGIOGRAPHY CHEST WITH CONTRAST TECHNIQUE: Multidetector CT imaging of the chest was  performed using the standard protocol during bolus administration of intravenous contrast. Multiplanar CT image reconstructions and MIPs were obtained to evaluate the vascular anatomy. CONTRAST:  100 cc Isovue 370 IV COMPARISON:  Chest radiograph earlier this date FINDINGS: Cardiovascular: Positive for acute pulmonary embolus with thin saddle embolus and clot extending into both main pulmonary arteries, lobar and segmental branches. Clot burden is moderate to large. The RV to LV ratio is 2.0 consistent with right heart strain. There is straightening of the intraventricular septum and contrast refluxing into the hepatic veins. Normal caliber thoracic aorta. Mediastinum/Nodes: No enlarged mediastinal, hilar, or axillary lymph nodes. Thyroid gland, trachea, and esophagus demonstrate no significant  findings. Lungs/Pleura: No pulmonary infarct. Lungs are clear. No pleural fluid. Upper Abdomen: No acute abnormality. Musculoskeletal: No chest wall abnormality. No acute or significant osseous findings. Review of the MIP images confirms the above findings. IMPRESSION: Positive for acute PE with CT evidence of right heart strain (RV/LV Ratio = 2.0) consistent with at least submassive (intermediate risk) PE. The presence of right heart strain has been associated with an increased risk of morbidity and mortality. Please activate Code PE by paging [phone]. Moderate to large clot burden with thin saddle component. Critical Value/emergent results were called by telephone at the time of interpretation on 10/24/2015 at 9:45 pm to Dr. Chapman Moss , who verbally acknowledged these results. Electronically Signed   By: Jeb Levering M.D.   On: 10/24/2015 21:46   Dg Chest Port 1 View  Result Date: 10/29/2015 CLINICAL DATA:  Pleuritic chest pain. EXAM: PORTABLE CHEST 1 VIEW COMPARISON:  CT 10/24/2015.  Chest x-ray 10/24/2015. FINDINGS: Mediastinum hilar structures normal. Mild right base subsegmental atelectasis. Heart size normal. No pleural effusion or pneumothorax. Mild bilateral apical pleural thickening noted most consistent scarring. IMPRESSION: Mild right base subsegmental atelectasis. Chest is otherwise negative . Electronically Signed   By: Marcello Moores  Register   On: 10/29/2015 07:26  2-D echo 10/25/15: Study Conclusions  - Left ventricle: The cavity size was normal. Wall thickness was   normal. Systolic function was normal. The estimated ejection   fraction was in the range of 60% to 65%. Wall motion was normal;   there were no regional wall motion abnormalities. Doppler   parameters are consistent with abnormal left ventricular   relaxation (grade 1 diastolic dysfunction). - Ventricular septum: Septal motion showed &quot;bounce&quot;. The contour   showed diastolic flattening consistent with elevated  right sided   pressures. - Right ventricle: The cavity size was moderately dilated. Wall   thickness was normal. Systolic function was moderately reduced. - Right atrium: The atrium was moderately dilated. - Tricuspid valve: There was severe regurgitation. - Pulmonary arteries: Systolic pressure was mildly increased. PA   peak pressure: 38 mm Hg (S).   Bilateral lower extremity venous Dopplers 10/25/15: Summary:  - Findings consistent with acute deep vein thrombosis involving the   left femoral vein, left popliteal vein, left posterior tibial   veins, and left peroneal veins. - No evidence of deep vein thrombosis involving the right lower   extremity.   Subjective: Overall feels much better. Improving dyspnea and chest pain which is now controlled on Percocet. No bleeding reported. Apparently developed vertigo yesterday with PT but did not bring to attention of physicians. States that PT worked with her and she has been asymptomatic since yesterday.  Discharge Exam:  Vitals:   10/29/15 2137 10/30/15 0555 10/30/15 1359 10/30/15 1559  BP: (!) 95/56 105/66 (!) 97/59   Pulse: 70  66 72   Resp: '17 16 17   '$ Temp: 98.9 F (37.2 C) 97.7 F (36.5 C) 97.7 F (36.5 C)   TempSrc: Oral Oral Oral   SpO2: 99% 100% 98% 99%  Weight:      Height:        General: Pt sitting up comfortably in chair this morning. Cardiovascular: S1 & S2 heard, RRR, S1/S2 +. No murmurs, rubs, gallops or clicks. No JVD or pedal edema. Respiratory: Clear to auscultation without wheezing, rhonchi or crackles. No increased work of breathing. Abdominal:  Non distended, non tender & soft. No organomegaly or masses appreciated. Normal bowel sounds heard. CNS: Alert and oriented. No focal deficits. Extremities: no edema, no cyanosis    The results of significant diagnostics from this hospitalization (including imaging, microbiology, ancillary and laboratory) are listed below for reference.      Microbiology: Recent Results (from the past 240 hour(s))  MRSA PCR Screening     Status: None   Collection Time: 10/25/15  1:48 PM  Result Value Ref Range Status   MRSA by PCR NEGATIVE NEGATIVE Final    Comment:        The GeneXpert MRSA Assay (FDA approved for NASAL specimens only), is one component of a comprehensive MRSA colonization surveillance program. It is not intended to diagnose MRSA infection nor to guide or monitor treatment for MRSA infections.      Labs: BNP (last 3 results)  Recent Labs  10/24/15 1856  BNP 474.2*   Basic Metabolic Panel:  Recent Labs Lab 10/24/15 1856 10/25/15 0604 10/26/15 0411  NA 136 137 137  K 4.4 3.9 3.9  CL 106 108 106  CO2 22 20* 22  GLUCOSE 138* 103* 104*  BUN '12 10 11  '$ CREATININE 0.93 0.83 0.88  CALCIUM 9.6 9.3 9.1   Liver Function Tests:  Recent Labs Lab 10/25/15 0604 10/26/15 0411  AST 22 18  ALT 19 21  ALKPHOS 44 41  BILITOT 0.8 0.5  PROT 6.7 7.0  ALBUMIN 3.4* 3.1*   No results for input(s): LIPASE, AMYLASE in the last 168 hours. No results for input(s): AMMONIA in the last 168 hours. CBC:  Recent Labs Lab 10/25/15 0604 10/26/15 0411 10/27/15 0428 10/28/15 0324 10/29/15 0302  WBC 13.4* 10.7* 10.1 10.6* 9.2  HGB 13.3 13.3 12.9 12.2 12.1  HCT 39.4 40.3 39.0 36.7 36.3  MCV 91.2 91.8 91.1 90.2 90.3  PLT 166 178 177 181 209   Cardiac Enzymes:  Recent Labs Lab 10/25/15 0604  TROPONINI 0.31*   Discussed in detail with patient's father and cousin at bedside. Updated care and answered questions.  Time coordinating discharge: Over 30 minutes  SIGNED:  Vernell Leep, MD, FACP, [place]. [facility] Pager [phone]  If 7PM-7AM, please contact night-coverage [email or web address] Password [email or web address] 10/30/2015, 4:19 PM

## 2015-10-30 NOTE — Progress Notes (Signed)
Pt discharged to home, note for work given to patient. Pt verbalizes understanding of discharge instructions, all questions answered. IV d/c'ed prior to discharge.

## 2015-10-30 NOTE — Care Management Note (Signed)
Case Management Note  Patient Details  Name: [name] MRN: [id number] Date of Birth: 03/14/1970  Subjective/Objective:  45 y.o.WF PMHx Hypothyroidism, HTNwho presented with sudden onset pleuritic chest pain and SOB. - CT of the chest showed bilateral PEs                 WUJ:WJXBJYPCP:Ronald Polite   Action/Plan: Plan is to d/c to home today. Pt is call Dr. Windy Fastonald Polite's office @ [phone] for post hospital follow up visit. CM left voice message with secretary regarding f/u appointment needed CM made pt aware.  Expected Discharge Date:  10/30/2015              Expected Discharge Plan:  Home/Self Care  In-House Referral:     Discharge planning Services  CM Consult, Medication Assistance  If discussed at Long Length of Stay Meetings, dates discussed:    Additional Comments: Pt given Xarelta cards by previous CM on SD unit. Pt verbally confirmed to current CM she has cards and understands the the usage of the Xarelto cards.  Gae GallopCole, [place]roughs [place], RN 10/30/2015, 10:27 AM

## 2016-02-24 ENCOUNTER — Encounter: Payer: Self-pay | Admitting: Internal Medicine

## 2016-02-24 ENCOUNTER — Ambulatory Visit (INDEPENDENT_AMBULATORY_CARE_PROVIDER_SITE_OTHER): Payer: Managed Care, Other (non HMO) | Admitting: Internal Medicine

## 2016-02-24 VITALS — BP 108/80 | HR 74 | Ht 63.0 in | Wt 166.0 lb

## 2016-02-24 DIAGNOSIS — I2782 Chronic pulmonary embolism: Secondary | ICD-10-CM | POA: Diagnosis not present

## 2016-02-24 DIAGNOSIS — R0781 Pleurodynia: Secondary | ICD-10-CM | POA: Diagnosis not present

## 2016-02-24 DIAGNOSIS — I2602 Saddle embolus of pulmonary artery with acute cor pulmonale: Secondary | ICD-10-CM | POA: Diagnosis not present

## 2016-02-24 MED ORDER — FAMOTIDINE 20 MG PO TABS: ORAL_TABLET | ORAL | 2 refills | Status: AC

## 2016-02-24 MED ORDER — PANTOPRAZOLE SODIUM 40 MG PO TBEC: 40.0000 mg | DELAYED_RELEASE_TABLET | Freq: Every day | ORAL | 2 refills | Status: DC

## 2016-02-24 NOTE — Assessment & Plan Note (Signed)
Dx 10/25/15 on bcps with RV strain and venous dopplers pos on L > rx xarelto - 02/24/2016  Walked RA x 3 laps @ 185 ft each stopped due to  End of study, rapid pace, no sob or desat - Echo 02/24/2016 >>>  - Venous dopplers L 02/24/2016 >>>   Very unlikely at this point she has active clot or TEPAH but since still symptomatic rec complete the w/u with V/Q (to rule organized thrombos that CTa might miss) and repeat echo/ venous dopplers > if these are neg then d/c xarelto 04/24/15.  Discussed in detail all the  indications, usual  risks and alternatives  relative to the benefits with patient who agrees to proceed with conservative f/u as outlined    Total time devoted to counseling  > 50 % of initial  60 min office visit:  review case with pt/ discussion of options/alternatives/ personally creating written customized instructions  in presence of pt  then going over those specific  Instructions directly with the pt including how to use all of the meds but in particular covering each new medication in detail and the difference between the maintenance/automatic meds and the prns using an action plan format for the latter.  Please see AVS from this visit for a full list of these instructions which I personally wrote for this pt and  are unique to this visit.

## 2016-02-24 NOTE — Progress Notes (Signed)
Subjective:    Patient ID: [name], female    DOB: 08/08/1970,  MRN: [id number]  HPI  [age] yowf never smoker on BCP's abrupt onset midline CP 10/23/15 then sob w/in a few hours > to ER 10/24/15 dx PE/ L DVT:   Admit date: 10/24/2015 Discharge date: 10/30/2015   Discharge Diagnoses:  Principal Problem:   Acute saddle pulmonary embolism with acute cor pulmonale [facility](HCC) Active Problems:   Essential hypertension   Hypothyroidism, acquired   Pulmonary embolism (HCC)   Acute saddle pulmonary embolism without acute cor pulmonale (HCC)   Pleuritic chest pain     02/24/2016 1st [facility] office visit/ Mashell Sieben   Chief Complaint  Patient presents with  . Pulmonary Consult    Referred by Dr Nehemiah SettlePolite. Pt states had PE in May 2017. She states that her CP and SOB got better, but have been worse since Dec 2017. She states that she can just be sitting and start feeling SOB and she gets out of breath walking approx 1/2 mile.   baseline power walking x 4 miles x 1.5 h and speak while walking > gradually improved p d/c with dx of pe  On xarelto maint but still not back to baseline = On day of visit recovered ex tol only able to do a mile and can't   talk now during ex assoc with sensation variable chest heaviness not directly related to walking and also happens at rest more of pressure than knife like but still midline similar to what she experience with PE and not pleuritic / lateralizing now or then.  Cp / sob not noct/ never disturbs sleep  No obvious other patterns in day to day or daytime variabilty or assoc chronic cough or cp or chest tightness, subjective wheeze overt sinus or hb symptoms. No unusual exp hx or h/o childhood pna/ asthma or knowledge of premature birth.  Sleeping ok without nocturnal  or early am exacerbation  of respiratory  c/o's or need for noct saba. Also denies any obvious fluctuation of symptoms with weather or environmental changes or other aggravating or  alleviating factors except as outlined above   Current Medications, Allergies, Complete Past Medical History, Past Surgical History, Family History, and Social History were reviewed in [facility] electronic medical record.           Review of Systems  Constitutional: Negative for chills, fever and unexpected weight change.  HENT: Negative for congestion, dental problem, ear pain, nosebleeds, postnasal drip, rhinorrhea, sinus pressure, sneezing, sore throat, trouble swallowing and voice change.   Eyes: Negative for visual disturbance.  Respiratory: Positive for shortness of breath. Negative for cough and choking.   Cardiovascular: Positive for chest pain. Negative for leg swelling.  Gastrointestinal: Negative for abdominal pain, diarrhea and vomiting.  Genitourinary: Negative for difficulty urinating.  Musculoskeletal: Positive for arthralgias.  Skin: Negative for rash.  Neurological: Negative for tremors, syncope and headaches.  Hematological: Does not bruise/bleed easily.       Objective:   Physical Exam  amb wf nad  Wt Readings from Last 3 Encounters:  02/24/16 166 lb (75.3 kg)  10/29/15 168 lb 11.2 oz (76.5 kg)  08/25/11 152 lb (68.9 kg)    Vital signs reviewed  - Note on arrival 02 sats  100% on RA      HEENT: nl dentition, turbinates, and oropharynx. Nl external ear canals without cough reflex   NECK :  without JVD/Nodes/TM/ nl carotid upstrokes bilaterally   LUNGS:  no acc muscle use,  Nl contour chest which is clear to A and P bilaterally without cough on insp or exp maneuvers   CV:  RRR  no s3 or murmur or increase in P2, nad no edema   ABD:  soft and nontender with nl inspiratory excursion in the supine position. No bruits or organomegaly appreciated, bowel sounds nl  MS:  Nl gait/ ext warm without deformities, calf tenderness, cyanosis or clubbing No obvious joint restrictions   SKIN: warm and dry without lesions    NEURO:  alert, approp, nl  sensorium with  no motor or cerebellar deficits apparent.             Assessment & Plan:

## 2016-02-24 NOTE — Assessment & Plan Note (Signed)
Although listed as pleuritic,  The chest discomfort she described in neither pleuritic nor lateralizing.  Midline pain can be due to PE but the mech is not pleurisy but rather RV ischemia form acute pressure overload.    Therefore the symptoms of cp and sob  are markedly disproportionate to objective findings and not clear this is a lung problem but pt does appear to have difficult airway management issues.   DDX of  difficult airways management almost all start with A and  include Adherence, Ace Inhibitors, Acid Reflux, Active Sinus Disease, Alpha 1 Antitripsin deficiency, Anxiety masquerading as Airways dz,  ABPA,  Allergy(esp in young), Aspiration (esp in elderly), Adverse effects of meds,  Active smokers, A bunch of PE's (a small clot burden can't cause this syndrome unless there is already severe underlying pulm or vascular dz with poor reserve) plus two Bs  = Bronchiectasis and Beta blocker use..and one C= CHF   ? Acid (or non-acid) GERD > always difficult to exclude as up to 75% of pts in some series report no assoc GI/ Heartburn symptoms> rec max (24h)  acid suppression and diet restrictions/ reviewed and instructions given in writing.   ? Anxiety / deconditioning > dx of exclusion  ? A bunch of PE's > w/u in progress but seems very doubtful here.    Will start with rx for gerd and return in 2 weeks to regroup-  see avs for instructions unique to this ov

## 2016-02-24 NOTE — Patient Instructions (Signed)
Please see patient coordinator before you leave today  to schedule v/q scan, echocardiogram and venous dopplers L  Pantoprazole (protonix) 40 mg   Take  30-60 min before first meal of the day and Pepcid (famotidine)  20 mg one @  bedtime until return to office - this is the best way to tell whether stomach acid is contributing to your problem.    GERD (REFLUX)  is an extremely common cause of respiratory symptoms just like yours , many times with no obvious heartburn at all.    It can be treated with medication, but also with lifestyle changes including elevation of the head of your bed (ideally with 6 inch  bed blocks),  Smoking cessation, avoidance of late meals, excessive alcohol, and avoid fatty foods, chocolate, peppermint, colas, red wine, and acidic juices such as orange juice.  NO MINT OR MENTHOL PRODUCTS SO NO COUGH DROPS   USE SUGARLESS CANDY INSTEAD (Jolley ranchers or Stover's or Life Savers) or even ice chips will also do - the key is to swallow to prevent all throat clearing. NO OIL BASED VITAMINS - use powdered substitutes.  Please schedule a follow up office visit in 2 weeks, sooner if needed

## 2016-03-02 ENCOUNTER — Ambulatory Visit (HOSPITAL_COMMUNITY)
Admission: RE | Admit: 2016-03-02 | Discharge: 2016-03-02 | Disposition: A | Discharge status: Home / Self Care | Payer: Managed Care, Other (non HMO) | Source: Ambulatory Visit | Attending: Internal Medicine | Admitting: Internal Medicine

## 2016-03-02 ENCOUNTER — Encounter (HOSPITAL_COMMUNITY)
Admission: RE | Admit: 2016-03-02 | Discharge: 2016-03-02 | Disposition: A | Discharge status: Home / Self Care | Payer: Managed Care, Other (non HMO) | Source: Ambulatory Visit | Attending: Internal Medicine | Admitting: Internal Medicine

## 2016-03-02 DIAGNOSIS — I2602 Saddle embolus of pulmonary artery with acute cor pulmonale: Secondary | ICD-10-CM

## 2016-03-02 DIAGNOSIS — I2782 Chronic pulmonary embolism: Secondary | ICD-10-CM | POA: Insufficient documentation

## 2016-03-02 MED ORDER — TECHNETIUM TC 99M DIETHYLENETRIAME-PENTAACETIC ACID: 30.9000 | Freq: Once | INTRAVENOUS | Status: DC | PRN

## 2016-03-02 MED ORDER — TECHNETIUM TO 99M ALBUMIN AGGREGATED
4.3900 | Freq: Once | INTRAVENOUS | Status: AC | PRN
  Administered 2016-03-02: 4.39 via INTRAVENOUS

## 2016-03-02 NOTE — Progress Notes (Signed)
Spoke with pt and notified of results per Dr. Wert. Pt verbalized understanding and denied any questions. 

## 2016-03-05 ENCOUNTER — Ambulatory Visit (HOSPITAL_COMMUNITY)
Admission: RE | Admit: 2016-03-05 | Discharge: 2016-03-05 | Disposition: A | Discharge status: Home / Self Care | Payer: Managed Care, Other (non HMO) | Source: Ambulatory Visit | Attending: Cardiovascular Disease | Admitting: Cardiovascular Disease

## 2016-03-05 DIAGNOSIS — I2782 Chronic pulmonary embolism: Secondary | ICD-10-CM | POA: Diagnosis not present

## 2016-03-09 NOTE — Progress Notes (Signed)
Spoke with pt and notified of results per Dr. Wert. Pt verbalized understanding and denied any questions. 

## 2016-03-10 ENCOUNTER — Encounter (HOSPITAL_COMMUNITY): Payer: Managed Care, Other (non HMO)

## 2016-03-11 ENCOUNTER — Other Ambulatory Visit: Payer: Self-pay

## 2016-03-11 ENCOUNTER — Ambulatory Visit (HOSPITAL_COMMUNITY): Payer: Managed Care, Other (non HMO) | Attending: Cardiovascular Disease

## 2016-03-11 DIAGNOSIS — I10 Essential (primary) hypertension: Secondary | ICD-10-CM | POA: Diagnosis not present

## 2016-03-11 DIAGNOSIS — I071 Rheumatic tricuspid insufficiency: Secondary | ICD-10-CM | POA: Diagnosis not present

## 2016-03-11 DIAGNOSIS — I2782 Chronic pulmonary embolism: Secondary | ICD-10-CM | POA: Insufficient documentation

## 2016-03-12 NOTE — Progress Notes (Signed)
Spoke with pt and notified of results per Dr. Wert. Pt verbalized understanding and denied any questions. 

## 2016-03-13 ENCOUNTER — Ambulatory Visit (INDEPENDENT_AMBULATORY_CARE_PROVIDER_SITE_OTHER): Payer: Managed Care, Other (non HMO) | Admitting: Internal Medicine

## 2016-03-13 ENCOUNTER — Encounter: Payer: Self-pay | Admitting: Internal Medicine

## 2016-03-13 VITALS — BP 104/70 | HR 75 | Temp 98.2°F | Ht 63.5 in | Wt 161.4 lb

## 2016-03-13 DIAGNOSIS — J069 Acute upper respiratory infection, unspecified: Secondary | ICD-10-CM

## 2016-03-13 DIAGNOSIS — I2699 Other pulmonary embolism without acute cor pulmonale: Secondary | ICD-10-CM | POA: Diagnosis not present

## 2016-03-13 DIAGNOSIS — R0781 Pleurodynia: Secondary | ICD-10-CM | POA: Diagnosis not present

## 2016-03-13 MED ORDER — AMOXICILLIN-POT CLAVULANATE 875-125 MG PO TABS: 1.0000 | ORAL_TABLET | Freq: Two times a day (BID) | ORAL | 0 refills | Status: AC

## 2016-03-13 NOTE — Assessment & Plan Note (Addendum)
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   - V/Q 03/02/2016  Single mismatch LUL  - Echo 03/11/16 > nl  No evidence at all to suggest her present symptoms have anything to do with PE > rec reconditioning and f/u venous dopplers at 3 months and if still showing  Chronic clot > vasc surgery eval vs reduce evista to xarelto to take 10 mg per day indefinitey as prophylactic   I had an extended discussion with the patient reviewing all relevant studies completed to date and  lasting 25 minutes of a 40  minute office visit   re  non-specific but potentially very serious pulmonary symptoms of unknown etiology in setting of know life threatening dvt/PE hx   Each maintenance medication was reviewed in detail including most importantly the difference between maintenance and prns and under what circumstances the prns are to be triggered using an action plan format that is not reflected in the computer generated alphabetically organized AVS.    Please see AVS for specific instructions unique to this office visit that I personally wrote and verbalized to the the pt in detail and then reviewed with pt  by my nurse highlighting any  changes in therapy recommended at today's visit to their plan of care.

## 2016-03-13 NOTE — Patient Instructions (Addendum)
To get the most out of exercise, you need to be continuously aware that you are short of breath, but never out of breath, for 30 minutes daily. As you improve, it will actually be easier for you to do the same amount of exercise  in  30 minutes so always push to the level where you are short of breath.    If still have having daytime chest pains :   Treatment consists of avoiding foods that cause gas (especially [place] food/ beans/ boiled eggs and raw vegetables like spinach and salads)  and citrucel 1 heaping tsp twice daily with a large glass of water.  Pain should improve w/in 2 weeks    No change in your medications   If the mucus does not turn clear start on Augmentin 875 mg take one pill twice daily  X 10 days - take at breakfast and supper with large glass of water.  It would help reduce the usual side effects (diarrhea and yeast infections) if you ate cultured yogurt at lunch.   GERD (REFLUX)  is an extremely common cause of respiratory symptoms just like yours , many times with no obvious heartburn at all.    It can be treated with medication, but also with lifestyle changes including elevation of the head of your bed (ideally with 6 inch  bed blocks),  Smoking cessation, avoidance of late meals, excessive alcohol, and avoid fatty foods, chocolate, peppermint, colas, red wine, and acidic juices such as orange juice.  NO MINT OR MENTHOL PRODUCTS SO NO COUGH DROPS   USE SUGARLESS CANDY INSTEAD (Jolley ranchers or Stover's or Life Savers) or even ice chips will also do - the key is to swallow to prevent all throat clearing. NO OIL BASED VITAMINS - use powdered substitutes.     Please schedule a follow up office visit in 6 weeks, call sooner if needed

## 2016-03-13 NOTE — Assessment & Plan Note (Signed)
Not responding to zpak so far with purulent nasal secretions so if not better > rx augmentin x 10 days to cover sinusitis better and then f/u here prn

## 2016-03-13 NOTE — Assessment & Plan Note (Addendum)
Not really pleuritic/ not lateralizing/ no better with gerd rx so far and always better supine >> much more suggestive of gas pains  Classic subdiaphragmatic pain pattern suggests ibs:  Stereotypical,   a very limited distribution of pain locations, daytime, not exacerbated by ex or coughing, worse in sitting position, associated with generalized abd bloating, not present supine due to the dome effect of the diaphragm is  canceled in that position. Frequently these patients have had multiple negative GI workups and CT scans.  Treatment consists of avoiding foods that cause gas (especially beans and raw vegetables like spinach and salads)  and citrucel 1 heaping tsp twice daily with a large glass of water.  Pain should improve w/in 2 weeks

## 2016-03-13 NOTE — Progress Notes (Signed)
Subjective:    Patient ID: [name], female    DOB: Jul 05, 1970,  MRN: [id number]    Brief patient profile:  47 yowf never smoker on BCP's abrupt onset midline CP 10/23/15 then sob w/in a few hours > to ER 10/24/15 dx PE/ L DVT:   Admit date: 10/24/2015 Discharge date: 10/30/2015   Discharge Diagnoses:  Principal Problem:   Acute saddle pulmonary embolism with acute cor pulmonale (HCC) Active Problems:   Essential hypertension   Hypothyroidism, acquired   Pulmonary embolism (HCC)   Acute saddle pulmonary embolism without acute cor pulmonale (HCC)   Pleuritic chest pain     02/24/2016 1st [facility] office visit/ Shawnya Mayor   Chief Complaint  Patient presents with  . Pulmonary Consult    Referred by Dr Nehemiah Settle. Pt states had PE in May 2017. She states that her CP and SOB got better, but have been worse since Dec 2017. She states that she can just be sitting and start feeling SOB and she gets out of breath walking approx 1/2 mile.   baseline power walking x 4 miles x 1.5 h and speak while walking > gradually improved p d/c with dx of pe  On xarelto maint but still not back to baseline = On day of visit recovered ex tol only able to do a mile and can't   talk now during ex assoc with sensation variable chest heaviness not directly related to walking and also happens at rest more of pressure than knife like but still midline similar to what she experience with PE and not pleuritic / lateralizing now or then.  Cp / sob not noct/ never disturbs sleep rec Pantoprazole (protonix) 40 mg   Take  30-60 min before first meal of the day and Pepcid (famotidine)  20 mg one @  bedtime until return to office - this is the best way to tell whether stomach acid is contributing to your problem.   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 Follow-up    Pt has had fever and sore throat for the past wk. She is coughing but no mucus production.  Her breathing has been worse. She is using tussionex for cough and is currently on zpack.    no change in symptoms at all since last ov, cp's fleeting, last up to 10 min, typically midline ant and resolve spont and never supine  No symptoms re L Leg at all   New head cold x one week, no better so far on zpak, nasal secretions still purulent   No obvious day to day or daytime variability or assoc   mucus plugs or hemoptysis or  chest tightness, subjective wheeze or overt sinus or hb symptoms. No unusual exp hx or h/o childhood pna/ asthma or knowledge of premature birth.  Sleeping ok without nocturnal  or early am exacerbation  of respiratory  c/o's or need for noct saba. Also denies any obvious fluctuation of symptoms with weather or environmental changes or other  aggravating or alleviating factors except as outlined above   Current Medications, Allergies, Complete Past Medical History, Past Surgical History, Family History, and Social History were reviewed in [facility] electronic medical record.  ROS  The following are not active complaints unless bolded sore throat, dysphagia, dental problems, itching, sneezing,  nasal congestion or excess/ purulent secretions, ear ache,   fever, chills, sweats, unintended wt loss, classically pleuritic or exertional cp,  orthopnea pnd or leg swelling, presyncope, palpitations, abdominal pain, anorexia, nausea, vomiting, diarrhea  or change in bowel or bladder habits, change in stools or urine, dysuria,hematuria,  rash, arthralgias, visual complaints, headache, numbness, weakness or ataxia or problems with walking or  coordination,  change in mood/affect or memory.                 Objective:   Physical Exam  amb wf nad   03/13/2016         161   02/24/16 166 lb (75.3 kg)  10/29/15 168 lb 11.2 oz (76.5 kg)  08/25/11 152 lb (68.9 kg)    Vital signs reviewed  - Note on arrival 02 sats  98% on RA     HEENT: nl dentition, turbinates, and oropharynx. Nl external ear canals without cough reflex   NECK :  without JVD/Nodes/TM/ nl carotid upstrokes bilaterally   LUNGS: no acc muscle use,  Nl contour chest which is clear to A and P bilaterally without cough on insp or exp maneuvers   CV:  RRR  no s3 or murmur or increase in P2, nad no edema   ABD:  soft and nontender with nl inspiratory excursion in the supine position. No bruits or organomegaly appreciated, bowel sounds nl  MS:  Nl gait/ ext warm without deformities, calf tenderness, cyanosis or clubbing No obvious joint restrictions   SKIN: warm and dry without lesions    NEURO:  alert, approp, nl sensorium with  no motor or cerebellar deficits apparent.             Assessment & Plan:

## 2016-04-20 ENCOUNTER — Emergency Department (HOSPITAL_BASED_OUTPATIENT_CLINIC_OR_DEPARTMENT_OTHER)
Admission: EM | Admit: 2016-04-20 | Discharge: 2016-04-20 | Disposition: A | Discharge status: Home / Self Care | Payer: 59 | Attending: Emergency Medicine | Admitting: Emergency Medicine

## 2016-04-20 ENCOUNTER — Encounter (HOSPITAL_BASED_OUTPATIENT_CLINIC_OR_DEPARTMENT_OTHER): Payer: Self-pay | Admitting: *Deleted

## 2016-04-20 ENCOUNTER — Emergency Department (HOSPITAL_BASED_OUTPATIENT_CLINIC_OR_DEPARTMENT_OTHER): Payer: 59

## 2016-04-20 DIAGNOSIS — R0789 Other chest pain: Secondary | ICD-10-CM | POA: Insufficient documentation

## 2016-04-20 DIAGNOSIS — E039 Hypothyroidism, unspecified: Secondary | ICD-10-CM | POA: Insufficient documentation

## 2016-04-20 DIAGNOSIS — Z79899 Other long term (current) drug therapy: Secondary | ICD-10-CM | POA: Insufficient documentation

## 2016-04-20 DIAGNOSIS — I10 Essential (primary) hypertension: Secondary | ICD-10-CM | POA: Insufficient documentation

## 2016-04-20 DIAGNOSIS — M79605 Pain in left leg: Secondary | ICD-10-CM | POA: Diagnosis not present

## 2016-04-20 DIAGNOSIS — R05 Cough: Secondary | ICD-10-CM | POA: Diagnosis not present

## 2016-04-20 HISTORY — DX: Acute embolism and thrombosis of unspecified deep veins of unspecified lower extremity: I82.409

## 2016-04-20 MED ORDER — TRAMADOL HCL 50 MG PO TABS: 50.0000 mg | ORAL_TABLET | Freq: Four times a day (QID) | ORAL | 0 refills | Status: AC | PRN

## 2016-04-20 NOTE — ED Provider Notes (Signed)
[facility] Provider Note   CSN: [id number] Arrival date & time: 04/20/16  1838  By signing my name below, I, Soijett Blue, attest that this documentation has been prepared under the direction and in the presence of Fayrene HelperBowie Onesimo Lingard, PA-C Electronically Signed: Soijett Blue, ED Scribe. 04/20/16. 7:31 PM.  History   Chief Complaint Chief Complaint  Patient presents with  . Leg Pain    HPI [name] is a 46 y.o. female with a PMHx of DVT, PE, HTN, who presents to the Emergency Department complaining of 6/10, constant, aching, posterior left leg pain onset yesterday. Pt reports associated chest tightness and cough. Pt has not tried any medications for the relief of her symptoms. She reports that she had a left DVT and PE dx in 10/2015 following foot surgery completed in July 2017. Pt states that she takes daily 20 mg xarelto since 10/2015. Denies back pain, abdominal pain, numbness, weakness, and any other symptoms.     The history is provided by the patient. No language interpreter was used.    Past Medical History:  Diagnosis Date  . DVT (deep venous thrombosis) (HCC)   . Headache   . Hypertension   . PE (pulmonary embolism) 10/2015    Patient Active Problem List   Diagnosis Date Noted  . Acute upper respiratory infection 03/13/2016  . Pulmonary embolism (HCC)   . Acute saddle pulmonary embolism without acute cor pulmonale (HCC)   . Pleuritic chest pain   . Acute saddle pulmonary embolism with acute cor pulmonale (HCC) 10/24/2015  . Essential hypertension 10/24/2015  . Hypothyroidism, acquired 10/24/2015    Past Surgical History:  Procedure Laterality Date  . BUNIONECTOMY    . CARPAL TUNNEL RELEASE    . CESAREAN SECTION      OB History    No data available       Home Medications    Prior to Admission medications   Medication Sig Start Date End Date Taking? Authorizing Provider  ALPRAZolam (XANAX) 0.25 MG tablet Take 0.25 mg by mouth 2 (two) times  daily as needed for anxiety.   Yes Historical Provider, MD  atenolol (TENORMIN) 50 MG tablet Take 50 mg by mouth daily.   Yes Historical Provider, MD  famotidine (PEPCID) 20 MG tablet One at bedtime 02/24/16  Yes Nyoka CowdenMichael B Wert, MD  levothyroxine (SYNTHROID, LEVOTHROID) 175 MCG tablet Take 175 mcg by mouth daily before breakfast.  08/15/15  Yes Historical Provider, MD  pantoprazole (PROTONIX) 40 MG tablet Take 1 tablet (40 mg total) by mouth daily. Take 30-60 min before first meal of the day 02/24/16  Yes Nyoka CowdenMichael B Wert, MD  SUMAtriptan (IMITREX) 25 MG tablet Take 25 mg by mouth every 2 (two) hours as needed for migraine.  09/21/15  Yes Historical Provider, MD  XARELTO 20 MG TABS tablet Take 1 tablet by mouth daily. 01/24/16  Yes Historical Provider, MD  azithromycin (ZITHROMAX) 250 MG tablet Take as directed 03/11/16   Historical Provider, MD  chlorpheniramine-HYDROcodone (TUSSIONEX) 10-8 MG/5ML SUER Take 5 mLs by mouth every 12 (twelve) hours as needed. 03/11/16   Historical Provider, MD  oxyCODONE-acetaminophen (PERCOCET/ROXICET) 5-325 MG tablet Take 1-2 tablets by mouth every 6 (six) hours as needed for moderate pain or severe pain. 10/30/15   Elease EtienneAnand D Hongalgi, MD    Family History Family History  Problem Relation Age of Onset  . Hypertension Father   . Hypothyroidism Father   . Stomach cancer Maternal Grandfather   . Pulmonary  embolism Neg Hx     Social History Social History  Substance Use Topics  . Smoking status: Never Smoker  . Smokeless tobacco: Never Used  . Alcohol use Yes     Allergies   Sulfa antibiotics   Review of Systems Review of Systems  Respiratory: Positive for cough and chest tightness.   Gastrointestinal: Negative for abdominal pain.  Musculoskeletal: Positive for myalgias (left posterior lower leg). Negative for back pain.  Neurological: Negative for weakness and numbness.     Physical Exam Updated Vital Signs BP 125/87   Pulse 66   Temp 98.1 F (36.7  C) (Oral)   Resp 20   Ht 5\' 4"  (1.626 m)   Wt 160 lb (72.6 kg)   SpO2 98%   BMI 27.46 kg/m   Physical Exam  Constitutional: She is oriented to person, place, and time. She appears well-developed and well-nourished. No distress.  HENT:  Head: Normocephalic and atraumatic.  Eyes: EOM are normal.  Neck: Neck supple.  Cardiovascular: Normal rate, regular rhythm and normal heart sounds.  Exam reveals no gallop and no friction rub.   No murmur heard. Pulmonary/Chest: Effort normal and breath sounds normal. No respiratory distress. She has no wheezes. She has no rales.  Abdominal: Soft. She exhibits no distension. There is no tenderness.  Musculoskeletal: Normal range of motion.       Left lower leg: She exhibits edema.  LLE: Very trace non-pitting edema noted to extemities as compared to right without palpable cords, edema, or erythema. No evidence of bakers cyst. Left knee with nl flexion and extension.   Neurological: She is alert and oriented to person, place, and time.  Skin: Skin is warm and dry.  Psychiatric: She has a normal mood and affect. Her behavior is normal.  Nursing note and vitals reviewed.    ED Treatments / Results  DIAGNOSTIC STUDIES: Oxygen Saturation is 98% on RA, nl by my interpretation.    COORDINATION OF CARE: 7:20 PM Discussed treatment plan with pt at bedside which includes US venous lower unilateral left, EKG, and pt agreed to plan.   Labs (all labs ordered are listed, but only abnormal results are displayed) Labs Reviewed - No data to display  EKG  EKG Interpretation None       Radiology US Venous Img Lower Unilateral Left  Result Date: 04/20/2016 CLINICAL DATA:  Left posterior thigh pain for 1 day, history of prior left DVT and saddle embolus in 2017. EXAM: Left LOWER EXTREMITY VENOUS DOPPLER ULTRASOUND TECHNIQUE: Gray-scale sonography with graded compression, as well as color Doppler and duplex ultrasound were performed to evaluate the lower  extremity deep venous systems from the level of the common femoral vein and including the common femoral, femoral, profunda femoral, popliteal and calf veins including the posterior tibial, peroneal and gastrocnemius veins when visible. The superficial great saphenous vein was also interrogated. Spectral Doppler was utilized to evaluate flow at rest and with distal augmentation maneuvers in the common femoral, femoral and popliteal veins. COMPARISON:  None. FINDINGS: Contralateral Common Femoral Vein: Respiratory phasicity is normal and symmetric with the symptomatic side. No evidence of thrombus. Normal compressibility. Common Femoral Vein: No evidence of thrombus. Normal compressibility, respiratory phasicity and response to augmentation. Saphenofemoral Junction: No evidence of thrombus. Normal compressibility and flow on color Doppler imaging. Profunda Femoral Vein: No evidence of thrombus. Normal compressibility and flow on color Doppler imaging. Femoral Vein: No evidence of thrombus. Normal compressibility, respiratory phasicity and response to augmentation. Popliteal  Vein: No evidence of thrombus. Normal compressibility, respiratory phasicity and response to augmentation. Calf Veins: No evidence of thrombus. Normal compressibility and flow on color Doppler imaging. Superficial Great Saphenous Vein: No evidence of thrombus. Normal compressibility and flow on color Doppler imaging. Venous Reflux:  None. Other Findings:  None. IMPRESSION: No evidence of left lower extremity deep venous thrombosis. Electronically Signed   By: Gaylyn Rong M.D.   On: 04/20/2016 20:18    Procedures Procedures (including critical care time)  Medications Ordered in ED Medications - No data to display   Initial Impression / Assessment and Plan / ED Course  I have reviewed the triage vital signs and the nursing notes.  Pertinent labs & imaging results that were available during my care of the patient were reviewed by  me and considered in my medical decision making (see chart for details).     BP 125/87   Pulse 66   Temp 98.1 F (36.7 C) (Oral)   Resp 20   Ht 5\' 4"  (1.626 m)   Wt 72.6 kg   SpO2 98%   BMI 27.46 kg/m    Final Clinical Impressions(s) / ED Diagnoses   Final diagnoses:  Left leg pain    New Prescriptions New Prescriptions   TRAMADOL (ULTRAM) 50 MG TABLET    Take 1 tablet (50 mg total) by mouth every 6 (six) hours as needed.   I personally performed the services described in this documentation, which was scribed in my presence. The recorded information has been reviewed and is accurate.   8:42 PM Pt here with L leg pain.  Hx of DVT.  On exam, no evidence to suggest infection, no trauma concerning for acute fx/dislocation.  She's able to ambulate.  Venous study of LLE without evidence of DVT.  Pt currently on Xarelto and will continue with the medication.  outpt f/u with pcp for further care.  Return precaution given.     Fayrene Helper, PA-C 04/20/16 2043    Nira Conn, MD 04/21/16 [phone]

## 2016-04-20 NOTE — Discharge Instructions (Signed)
No evidence of blood clot in your left leg.  Take tramadol as needed for your pain and follow up with your doctor for further care.

## 2016-04-20 NOTE — ED Notes (Signed)
Pt in ultrasound

## 2016-04-20 NOTE — ED Triage Notes (Signed)
Pain in the back of her left upper leg since yesterday. tightness in her right chest. Hx of DVT in September. She is taking blood thinners.

## 2016-04-20 NOTE — ED Notes (Signed)
Pt verbalizes understanding of d/c instructions and denies any further needs at this time. 

## 2016-04-24 ENCOUNTER — Ambulatory Visit (INDEPENDENT_AMBULATORY_CARE_PROVIDER_SITE_OTHER): Payer: 59 | Admitting: Internal Medicine

## 2016-04-24 ENCOUNTER — Encounter: Payer: Self-pay | Admitting: Internal Medicine

## 2016-04-24 VITALS — BP 112/80 | HR 74 | Temp 98.1°F | Ht 64.0 in | Wt 164.0 lb

## 2016-04-24 DIAGNOSIS — I2782 Chronic pulmonary embolism: Secondary | ICD-10-CM | POA: Diagnosis not present

## 2016-04-24 DIAGNOSIS — R059 Cough, unspecified: Secondary | ICD-10-CM

## 2016-04-24 DIAGNOSIS — R05 Cough: Secondary | ICD-10-CM

## 2016-04-24 MED ORDER — CEFDINIR 300 MG PO CAPS: 300.0000 mg | ORAL_CAPSULE | Freq: Two times a day (BID) | ORAL | 0 refills | Status: DC

## 2016-04-24 NOTE — Patient Instructions (Signed)
For cough mucinex dm 1200 mg every 12hours  If mucus turns nast omnicef 300 mg twice daily x 7 days   Let [name] know about the leg and use Tramadol for severe cough or severe pain  (tylenol would be first choice)  GERD (REFLUX)  is an extremely common cause of respiratory symptoms just like yours , many times with no obvious heartburn at all.    It can be treated with medication, but also with lifestyle changes including elevation of the head of your bed (ideally with 6 inch  bed blocks),  Smoking cessation, avoidance of late meals, excessive alcohol, and avoid fatty foods, chocolate, peppermint, colas, red wine, and acidic juices such as orange juice.  NO MINT OR MENTHOL PRODUCTS SO NO COUGH DROPS   USE SUGARLESS CANDY INSTEAD (Jolley ranchers or Stover's or Life Savers) or even ice chips will also do - the key is to swallow to prevent all throat clearing. NO OIL BASED VITAMINS - use powdered substitutes.  Please schedule a follow up visit in 3 months but call sooner if needed  with all medications /inhalers/ solutions in hand so we can verify exactly what you are taking. This includes all medications from all doctors and over the counters

## 2016-04-24 NOTE — Progress Notes (Signed)
Subjective:    Patient ID: [name], female    DOB: [birth year],  MRN: [id number]    Brief patient profile:  50 yowf never smoker on BCP's abrupt onset midline CP 10/23/15 then sob w/in a few hours > to ER 10/24/15 dx PE/ L DVT:   Admit date: 10/24/2015 Discharge date: 10/30/2015   Discharge Diagnoses:  Principal Problem:   Acute saddle pulmonary embolism with acute cor pulmonale (HCC) Active Problems:   Essential hypertension   Hypothyroidism, acquired   Pulmonary embolism (HCC)   Acute saddle pulmonary embolism without acute cor pulmonale (HCC)   Pleuritic chest pain     02/24/2016 1st [facility] office visit/ Aceson Labell   Chief Complaint  Patient presents with  . Pulmonary Consult    Referred by Dr Nehemiah Settle. Pt states had PE in May 2017. She states that her CP and SOB got better, but have been worse since Dec 2017. She states that she can just be sitting and start feeling SOB and she gets out of breath walking approx 1/2 mile.   baseline power walking x 4 miles x 1.5 h and speak while walking > gradually improved p d/c with dx of pe  On xarelto maint but still not back to baseline = On day of visit recovered ex tol only able to do a mile and can't   talk now during ex assoc with sensation variable chest heaviness not directly related to walking and also happens at rest more of pressure than knife like but still midline similar to what she experience with PE and not pleuritic / lateralizing now or then.  Cp / sob not noct/ never disturbs sleep rec Pantoprazole (protonix) 40 mg   Take  30-60 min before first meal of the day and Pepcid (famotidine)  20 mg one @  bedtime until return to office - this is the best way to tell whether stomach acid is contributing to your problem.   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 Follow-up    Pt has had fever and sore throat for the past wk. She is coughing but no mucus production.  Her breathing has been worse. She is using tussionex for cough and is currently on zpack.   No change in symptoms at all since last ov, cp's fleeting, last up to 10 min, typically midline ant and resolve spont and never supine No symptoms re L Leg at all  New head cold x one week, no better so far on zpak, nasal secretions still purulent  rec To get the most out of exercise, you need to be continuously aware that you are short of breath, but never out of breath, for 30 minutes daily. As you improve, it will actually be easier for you to do the same amount of exercise  in  30 minutes so always push to the level where you are short of breath.   If still have having daytime chest pains :   Treatment consists of  avoiding foods that cause gas (especially [place] food/ beans/ boiled eggs and raw vegetables like spinach and salads)  and citrucel 1 heaping tsp twice daily with a large glass of water.  Pain should improve w/in 2 weeks   No change in your medications  If the mucus does not turn clear start on Augmentin 875 mg take one pill twice daily  X 10 days - take at breakfast and supper with large glass of water.  It would help reduce the usual side effects (diarrhea and yeast infections) if you ate cultured yogurt at lunch.  GERD  Diet      04/24/2016  f/u ov/Leeroy Lovings re:  L leg pain and cough/ chest tightness  developed on xarelto and gerd rx Chief Complaint  Patient presents with  . Follow-up    Pt c/o tightness and heaviness in her chest for the past 2 days. She is also coughing more, sputum is clear.  She c/o left leg pain- started 6 days ago. She went to ED on  04/20/16 and had venous doppler done and was told this was neg.     onset of pain 04/19/16 behind the L knee extending from thigh/ assoc with tingling> seen in ER with neg Venous doppler, denies injury and has no back pain or joint stiffness or trouble walking. Then chest tightness in setting severe coughing with just mucoid sputum s assoc rhinitis or typical uri symtpoms  No obvious day to day or daytime variability or assoc excess/ purulent sputum or mucus plugs or hemoptysis or   subjective wheeze or overt sinus or hb symptoms. No unusual exp hx or h/o childhood pna/ asthma or knowledge of premature birth.  Sleeping ok without nocturnal  or early am exacerbation  of respiratory  c/o's or need for noct saba. Also denies any obvious fluctuation of symptoms with weather or environmental changes or other aggravating or alleviating factors except as outlined above   Current Medications, Allergies, Complete Past Medical History, Past Surgical History, Family History, and Social History were reviewed in [employer] record.  ROS  The following are not active complaints unless bolded sore throat, dysphagia, dental problems, itching, sneezing,  nasal congestion or excess/ purulent secretions, ear ache,   fever, chills, sweats, unintended wt loss, classically pleuritic or exertional cp,  orthopnea pnd or leg swelling, presyncope, palpitations, abdominal pain, anorexia, nausea, vomiting, diarrhea  or change in bowel or bladder habits, change in stools or urine, dysuria,hematuria,  rash, arthralgias, visual complaints, headache, numbness, weakness or ataxia or problems with walking or coordination,  change in mood/affect or memory.                Objective:   Physical Exam  amb wf nad  04/24/2016        164   03/13/2016         161   02/24/16 166 lb (75.3 kg)  10/29/15 168 lb 11.2 oz (76.5 kg)  08/25/11 152 lb (68.9 kg)    Vital signs reviewed   - Note on arrival 02 sats  98% on  RA     HEENT: nl dentition, turbinates, and oropharynx. Nl external ear canals without cough reflex   NECK :  without JVD/Nodes/TM/ nl carotid upstrokes bilaterally   LUNGS: no acc muscle use,  Nl contour chest which is clear to A and P bilaterally without cough on insp or exp maneuvers   CV:  RRR  no s3 or murmur or increase in P2,  nad no edema   ABD:  soft and nontender with nl inspiratory excursion in the supine position. No bruits or organomegaly appreciated, bowel sounds nl  MS:  Nl gait/ ext warm without deformities, calf tenderness, cyanosis or clubbing No obvious joint restrictions including L hip/ knee/ neg slr   SKIN: warm and dry without lesions    NEURO:  alert, approp, nl sensorium with  no motor or cerebellar deficits apparent.             Assessment & Plan:

## 2016-04-26 DIAGNOSIS — R05 Cough: Secondary | ICD-10-CM | POA: Insufficient documentation

## 2016-04-26 DIAGNOSIS — R059 Cough, unspecified: Secondary | ICD-10-CM | POA: Insufficient documentation

## 2016-04-26 NOTE — Assessment & Plan Note (Signed)
Already on gerd rx but not diet so advised on diet and use of tramadol for severe cough if not controlled with mucinex dm up to 1200 mg bid plus omnicef if mucus turns nasty suggesting this is a complicated uri though nothing in hx to suggest this now.  see avs for instructions unique to this ov

## 2016-04-26 NOTE — Assessment & Plan Note (Signed)
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  rec rx wth leg with tramadol and see ortho next   I had an extended discussion with the patient reviewing all relevant studies completed to date and  lasting 29 minutes of a 25  minute acute  visit addressing      non-specific but potentially very serious  symptoms of unknown etiology.  Each maintenance medication was reviewed in detail including most importantly the difference between maintenance and prns and under what circumstances the prns are to be triggered using an action plan format that is not reflected in the computer generated alphabetically organized AVS.    Please see AVS for specific instructions unique to this office visit that I personally wrote and verbalized to the the pt in detail and then reviewed with pt  by my nurse highlighting any changes in therapy/plan of care  recommended at today's visit.

## 2016-06-16 ENCOUNTER — Other Ambulatory Visit: Payer: Self-pay | Admitting: Internal Medicine

## 2016-06-16 ENCOUNTER — Ambulatory Visit
Admission: RE | Admit: 2016-06-16 | Discharge: 2016-06-16 | Disposition: A | Discharge status: Home / Self Care | Payer: 59 | Source: Ambulatory Visit | Attending: Internal Medicine | Admitting: Internal Medicine

## 2016-06-16 DIAGNOSIS — M545 Low back pain: Secondary | ICD-10-CM

## 2016-07-28 ENCOUNTER — Encounter: Payer: Self-pay | Admitting: Internal Medicine

## 2016-07-28 ENCOUNTER — Ambulatory Visit (INDEPENDENT_AMBULATORY_CARE_PROVIDER_SITE_OTHER): Payer: 59 | Admitting: Internal Medicine

## 2016-07-28 VITALS — BP 120/72 | HR 107 | Ht 63.0 in | Wt 162.2 lb

## 2016-07-28 DIAGNOSIS — I2699 Other pulmonary embolism without acute cor pulmonale: Secondary | ICD-10-CM | POA: Diagnosis not present

## 2016-07-28 NOTE — Assessment & Plan Note (Signed)
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  > repeated 04/20/16 neg  - V/Q 03/02/2016  Single mismatch LUL  - Echo 03/11/16 > nl  Discussed in detail all the  indications, usual  risks and alternatives  relative to the benefits with patient who agrees to proceed with completing present rx then change to asa 325 mg daily and f/u w/in 2 weeks to complete hypercoagulable profile  I had an extended discussion with the patient reviewing all relevant studies completed to date and  lasting 15 to 20 minutes of a 25 minute visit    Each maintenance medication was reviewed in detail including most importantly the difference between maintenance and prns and under what circumstances the prns are to be triggered using an action plan format that is not reflected in the computer generated alphabetically organized AVS.    Please see AVS for specific instructions unique to this visit that I personally wrote and verbalized to the the pt in detail and then reviewed with pt  by my nurse highlighting any  changes in therapy recommended at today's visit to their plan of care.

## 2016-07-28 NOTE — Patient Instructions (Signed)
When you finish the xarelto, start coated aspirin 325 mg daily   Please schedule a follow up office visit in 6 weeks, call sooner if needed

## 2016-07-28 NOTE — Progress Notes (Signed)
Subjective:    Patient ID: [name], female    DOB: [birth year],  MRN: [id number]    Brief patient profile:  50 yowf never smoker on BCP's abrupt onset midline CP 10/23/15 then sob w/in a few hours > to ER 10/24/15 dx PE/ L DVT:   Admit date: 10/24/2015 Discharge date: 10/30/2015   Discharge Diagnoses:  Principal Problem:   Acute saddle pulmonary embolism with acute cor pulmonale (HCC) Active Problems:   Essential hypertension   Hypothyroidism, acquired   Pulmonary embolism (HCC)   Acute saddle pulmonary embolism without acute cor pulmonale (HCC)   Pleuritic chest pain     02/24/2016 1st [facility] office visit/ [facility]   Chief Complaint  Patient presents with  . Pulmonary Consult    Referred by Dr Nehemiah Settle. Pt states had PE in May 2017. She states that her CP and SOB got better, but have been worse since Dec 2017. She states that she can just be sitting and start feeling SOB and she gets out of breath walking approx 1/2 mile.   baseline power walking x 4 miles x 1.5 h and speak while walking > gradually improved p d/c with dx of pe  On xarelto maint but still not back to baseline = On day of visit recovered ex tol only able to do a mile and can't   talk now during ex assoc with sensation variable chest heaviness not directly related to walking and also happens at rest more of pressure than knife like but still midline similar to what she experience with PE and not pleuritic / lateralizing now or then.  Cp / sob not noct/ never disturbs sleep rec Pantoprazole (protonix) 40 mg   Take  30-60 min before first meal of the day and Pepcid (famotidine)  20 mg one @  bedtime until return to office - this is the best way to tell whether stomach acid is contributing to your problem.   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 Follow-up    Pt has had fever and sore throat for the past wk. She is coughing but no mucus production.  Her breathing has been worse. She is using tussionex for cough and is currently on zpack.   No change in symptoms at all since last ov, cp's fleeting, last up to 10 min, typically midline ant and resolve spont and never supine No symptoms re L Leg at all  New head cold x one week, no better so far on zpak, nasal secretions still purulent  rec To get the most out of exercise, you need to be continuously aware that you are short of breath, but never out of breath, for 30 minutes daily. As you improve, it will actually be easier for you to do the same amount of exercise  in  30 minutes so always push to the level where you are short of breath.   If still have having daytime chest pains :   Treatment consists of  avoiding foods that cause gas (especially [place] food/ beans/ boiled eggs and raw vegetables like spinach and salads)  and citrucel 1 heaping tsp twice daily with a large glass of water.  Pain should improve w/in 2 weeks   No change in your medications  If the mucus does not turn clear start on Augmentin 875 mg take one pill twice daily  X 10 days - take at breakfast and supper with large glass of water.  It would help reduce the usual side effects (diarrhea and yeast infections) if you ate cultured yogurt at lunch.  GERD  Diet      04/24/2016  f/u ov/Wert re:  L leg pain and cough/ chest tightness  developed on xarelto and gerd rx Chief Complaint  Patient presents with  . Follow-up    Pt c/o tightness and heaviness in her chest for the past 2 days. She is also coughing more, sputum is clear.  She c/o left leg pain- started 6 days ago. She went to ED on  04/20/16 and had venous doppler done and was told this was neg.     onset of pain 04/19/16 behind the L knee extending from thigh/ assoc with tingling> seen in ER with neg Venous doppler, denies injury and has no back pain or joint stiffness or trouble walking. Then chest tightness in setting severe coughing with just mucoid sputum s assoc rhinitis or typical uri symtpoms rec For cough mucinex dm 1200 mg every 12hours If mucus turns nast omnicef 300 mg twice daily x 7 days  Let Dr Nehemiah SettlePolite know about the leg and use Tramadol for severe cough or severe pain  (tylenol would be first choice) GERD rx  Please schedule a follow up visit in 3 months but call sooner if needed  with all medications /inhalers/ solutions in hand so we can verify exactly what you are taking. This includes all medications from all doctors and over the counters    07/28/2016  f/u ov/Wert re: PE on bcp with L leg dvt  Chief Complaint  Patient presents with  . Follow-up    Pt is here today for her follow-up visit.  Denies any complaints since last visit.  Denies any SOB or chest pain.  no symptoms at all including able to mountain climb  No more leg/ back pain on L/ no leg swelling  No obvious day to day or daytime variability or assoc excess/ purulent sputum or mucus plugs or hemoptysis or cp or chest tightness, subjective wheeze or overt sinus or hb symptoms. No unusual exp hx or h/o childhood pna/ asthma or knowledge of premature birth.  Sleeping ok without nocturnal  or early am exacerbation  of respiratory  c/o's or need for noct saba. Also denies any obvious fluctuation of symptoms with weather or environmental changes or other aggravating or alleviating factors except as outlined above   Current Medications, Allergies, Complete Past Medical History, Past Surgical History, Family History, and Social History were reviewed in [facility] electronic [facility].  ROS  The following are not active complaints unless  bolded sore throat, dysphagia, dental problems, itching, sneezing,  nasal congestion or excess/ purulent secretions, ear ache,   fever, chills, sweats, unintended wt loss, classically pleuritic or exertional cp,  orthopnea pnd or leg swelling, presyncope, palpitations, abdominal pain, anorexia, nausea, vomiting, diarrhea  or change in bowel or bladder habits, change in stools or urine, dysuria,hematuria,  rash, arthralgias, visual complaints, headache, numbness, weakness or ataxia or problems with  walking or coordination,  change in mood/affect or memory.                   Objective:   Physical Exam  amb wf nad   07/28/2016        162  04/24/2016        164   03/13/2016         161   02/24/16 166 lb (75.3 kg)  10/29/15 168 lb 11.2 oz (76.5 kg)  08/25/11 152 lb (68.9 kg)    Vital signs reviewed   - Note on arrival 02 sats  100% on RA     HEENT: nl dentition, turbinates, and oropharynx. Nl external ear canals without cough reflex   NECK :  without JVD/Nodes/TM/ nl carotid upstrokes bilaterally   LUNGS: no acc muscle use,  Nl contour chest which is clear to A and P bilaterally without cough on insp or exp maneuvers   CV:  RRR  no s3 or murmur or increase in P2, nad no edema   ABD:  soft and nontender with nl inspiratory excursion in the supine position. No bruits or organomegaly appreciated, bowel sounds nl  MS:  Nl gait/ ext warm without deformities, calf tenderness, cyanosis or clubbing No obvious joint restrictions   SKIN: warm and dry without lesions    NEURO:  alert, approp, nl sensorium with  no motor or cerebellar deficits apparent.             Assessment & Plan:

## 2016-09-08 ENCOUNTER — Other Ambulatory Visit: Payer: 59

## 2016-09-08 ENCOUNTER — Encounter: Payer: Self-pay | Admitting: Internal Medicine

## 2016-09-08 ENCOUNTER — Ambulatory Visit (INDEPENDENT_AMBULATORY_CARE_PROVIDER_SITE_OTHER): Payer: 59 | Admitting: Internal Medicine

## 2016-09-08 VITALS — BP 118/70 | HR 70 | Ht 63.0 in | Wt 166.0 lb

## 2016-09-08 DIAGNOSIS — I2699 Other pulmonary embolism without acute cor pulmonale: Secondary | ICD-10-CM

## 2016-09-08 NOTE — Progress Notes (Signed)
Subjective:    Patient ID: [name], female    DOB: [birth year],  MRN: [id number]    Brief patient profile:  23 yowf never smoker on BCP's L foot surgery September 02 2015 cast/boot 6-8 boots then abrupt onset midline CP 10/23/15 then sob w/in a few hours > to ER 10/24/15 dx PE/ L DVT:   Admit date: 10/24/2015 Discharge date: 10/30/2015   Discharge Diagnoses:  Principal Problem:   Acute saddle pulmonary embolism with acute cor pulmonale (HCC) Active Problems:   Essential hypertension   Hypothyroidism, acquired   Pulmonary embolism (HCC)   Acute saddle pulmonary embolism without acute cor pulmonale (HCC)   Pleuritic chest pain     02/24/2016 1st [facility] office visit/ [name]   Chief Complaint  Patient presents with  . Pulmonary Consult    Referred by Dr [name]. Pt states had PE in May 2017. She states that her CP and SOB got better, but have been worse since Dec 2017. She states that she can just be sitting and start feeling SOB and she gets out of breath walking approx 1/2 mile.   baseline power walking x 4 miles x 1.5 h and speak while walking > gradually improved p d/c with dx of pe  On xarelto maint but still not back to baseline = On day of visit recovered ex tol only able to do a mile and can't   talk now during ex assoc with sensation variable chest heaviness not directly related to walking and also happens at rest more of pressure than knife like but still midline similar to what she experience with PE and not pleuritic / lateralizing now or then.  Cp / sob not noct/ never disturbs sleep rec Pantoprazole (protonix) 40 mg   Take  30-60 min before first meal of the day and Pepcid (famotidine)  20 mg one @  bedtime until return to office - this is the best way to tell whether stomach acid is contributing to your problem.   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 Follow-up    Pt has had fever and sore throat for the past wk. She is coughing but no mucus production.  Her breathing has been worse. She is using tussionex for cough and is currently on zpack.   No change in symptoms at all since last ov, cp's fleeting, last up to 10 min, typically midline ant and resolve spont and never supine No symptoms re L Leg at all  New head cold x one week, no better so far on zpak, nasal secretions still purulent  rec To get the most out of exercise, you need to be continuously aware that you are short of breath, but never out of breath, for 30 minutes daily. As you improve, it will actually be easier for you to do the same amount of exercise  in  30 minutes so always push to the level where you are short of breath.   If still have  having daytime chest pains :   Treatment consists of avoiding foods that cause gas (especially [place] food/ beans/ boiled eggs and raw vegetables like spinach and salads)  and citrucel 1 heaping tsp twice daily with a large glass of water.  Pain should improve w/in 2 weeks   No change in your medications  If the mucus does not turn clear start on Augmentin 875 mg take one pill twice daily  X 10 days - take at breakfast and supper with large glass of water.  It would help reduce the usual side effects (diarrhea and yeast infections) if you ate cultured yogurt at lunch.  GERD  Diet      04/24/2016  f/u ov/[name] re:  L leg pain and cough/ chest tightness  developed on xarelto and gerd rx Chief Complaint  Patient presents with  . Follow-up    Pt c/o tightness and heaviness in her chest for the past 2 days. She is also coughing more, sputum is clear.  She c/o  left leg pain- started 6 days ago. She went to ED on 04/20/16 and had venous doppler done and was told this was neg.     onset of pain 04/19/16 behind the L knee extending from thigh/ assoc with tingling> seen in ER with neg Venous doppler, denies injury and has no back pain or joint stiffness or trouble walking. Then chest tightness in setting severe coughing with just mucoid sputum s assoc rhinitis or typical uri symtpoms rec For cough mucinex dm 1200 mg every 12hours If mucus turns nast omnicef 300 mg twice daily x 7 days  Let Dr Carolyn SettlePolite know about the leg and use Tramadol for severe cough or severe pain  (tylenol would be first choice) GERD rx  Please schedule a follow up visit in 3 months but call sooner if needed  with all medications /inhalers/ solutions in hand so we can verify exactly what you are taking. This includes all medications from all doctors and over the counters    07/28/2016  f/u ov/[name] re: PE on bcp with L leg dvt  Chief Complaint  Patient presents with  . Follow-up    Pt is here today for her follow-up visit.  Denies any complaints since last visit.  Denies any SOB or chest pain.  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 No unusual exp hx or h/o childhood pna/ asthma or knowledge of premature birth.  Sleeping ok without nocturnal  or early am exacerbation  of respiratory  c/o's or need for noct saba. Also denies any  obvious fluctuation of symptoms with weather or environmental changes or other aggravating or alleviating factors except as outlined above   Current Medications, Allergies, Complete Past Medical History, Past Surgical History, Family History, and Social History were reviewed in [facility] electronic medical record.  ROS  The  following are not active complaints unless bolded sore throat, dysphagia, dental problems, itching, sneezing,  nasal congestion or excess/ purulent secretions, ear ache,   fever, chills, sweats, unintended wt loss, classically pleuritic or exertional cp,  orthopnea pnd or leg swelling, presyncope, palpitations, abdominal pain, anorexia, nausea, vomiting, diarrhea  or change in bowel or bladder habits, change in stools or urine, dysuria,hematuria,  rash, arthralgias, visual complaints, headache, numbness, weakness or ataxia or problems with walking or coordination,  change in mood/affect or memory.                      Objective:   Physical Exam  amb wf nad  09/08/2016        166  07/28/2016        162  04/24/2016        164   03/13/2016         161   02/24/16 166 lb (75.3 kg)  10/29/15 168 lb 11.2 oz (76.5 kg)  08/25/11 152 lb (68.9 kg)    Vital signs reviewed   - Note on arrival 02 sats  98% on RA     HEENT: nl dentition, turbinates, and oropharynx. Nl external ear canals without cough reflex   NECK :  without JVD/Nodes/TM/ nl carotid upstrokes bilaterally   LUNGS: no acc muscle use,  Nl contour chest which is clear to A and P bilaterally without cough on insp or exp maneuvers   CV:  RRR  no s3 or murmur or increase in P2, nad no edema   ABD:  soft and nontender with nl inspiratory excursion in the supine position. No bruits or organomegaly appreciated, bowel sounds nl  MS:  Nl gait/ ext warm without deformities, calf tenderness, cyanosis or clubbing No obvious joint restrictions   SKIN: warm and dry without lesions    NEURO:  alert, approp,  nl sensorium with  no motor or cerebellar deficits apparent.      Labs ordered 09/08/2016   Hypercoagulability Profile             Assessment & Plan:

## 2016-09-08 NOTE — Patient Instructions (Signed)
Please remember to go to the lab department downstairs in the basement  for your tests - we will call you with the results when they are available.

## 2016-09-08 NOTE — Assessment & Plan Note (Signed)
Dx 10/25/15 on bcps with RV strain and venous dopplers pos on L > rx xarelto - 02/24/2016  Walked RA x 3 laps @ 185 ft each stopped due to  End of study, rapid pace, no sob or desat - Echo   03/11/16 > nl  - Venous dopplers   03/05/16 chronic L  thrombus gastrocnemius  > repeated 04/20/16 neg  - V/Q 03/02/2016  Single mismatch LUL  - Echo 03/11/16 > nl  - 08/23/16 off xarelto and on asa 325 mg daily  - 09/08/2016 hypercoagulability profile sent - if any positives then rec hematology eval but based on clot with surgery and bcp's as risk factors the only risk factor she has remaining is the h/o of dvt/pe  Advised lots of aerobic ex/ keep cal bal neg if possible  I had an extended discussion with the patient reviewing all relevant studies completed to date and  lasting 15 to 20 minutes of a 25 minute visit    Discussed in detail all the  indications, usual  risks and alternatives  relative to the benefits with patient who agrees to proceed with conservative f/u as outlined  With pulmonary f/u prn and heme as directed by labs w/a  Each maintenance medication was reviewed in detail including most importantly the difference between maintenance and prns and under what circumstances the prns are to be triggered using an action plan format that is not reflected in the computer generated alphabetically organized AVS.    Please see AVS for specific instructions unique to this visit that I personally wrote and verbalized to the the pt in detail and then reviewed with pt  by my nurse highlighting any  changes in therapy recommended at today's visit to their plan of care.

## 2016-09-11 LAB — RFX PTT-LA W/RFX TO HEX PHASE CONF: PTT-LA SCREEN: 30 s (ref <=40)

## 2016-09-11 LAB — RFX DRVVT SCR W/RFLX CONF 1:1 MIX: dRVVT Screen: 35 s (ref <=45)

## 2016-09-12 LAB — HYPERCOAGULABLE PANEL, COMPREHENSIVE
ANTITHROMB III FUNC: 110 %{activity} (ref 80–120)
Anticardiolipin IgA: <11 [APL'U]
Anticardiolipin IgG: <14 [GPL'U]
Anticardiolipin IgM: <12 [MPL'U]
Beta-2 Glyco I IgG: <9 SGU (ref <=20)
Beta-2-Glycoprotein I IgA: <9 SAU (ref <=20)
Beta-2-Glycoprotein I IgM: <9 SMU (ref <=20)
PROTEIN C ANTIGEN: 100 % (ref 70–140)
PROTEIN S ANTIGEN, TOTAL: 103 % (ref 70–140)
Protein C Activity: 92 % (ref 70–180)
Protein S Activity: 115 % (ref 60–140)

## 2016-09-14 NOTE — Progress Notes (Signed)
LMTCB

## 2016-09-14 NOTE — Progress Notes (Signed)
Spoke with pt and notified of results per Dr. Wert. Pt verbalized understanding and denied any questions. 

## 2017-12-07 ENCOUNTER — Other Ambulatory Visit (HOSPITAL_COMMUNITY): Payer: Self-pay | Admitting: Internal Medicine

## 2017-12-07 ENCOUNTER — Emergency Department (HOSPITAL_BASED_OUTPATIENT_CLINIC_OR_DEPARTMENT_OTHER): Payer: 59

## 2017-12-07 ENCOUNTER — Encounter (HOSPITAL_COMMUNITY): Payer: Self-pay | Admitting: *Deleted

## 2017-12-07 ENCOUNTER — Ambulatory Visit (HOSPITAL_COMMUNITY): Payer: 59

## 2017-12-07 ENCOUNTER — Other Ambulatory Visit: Payer: Self-pay

## 2017-12-07 ENCOUNTER — Emergency Department (HOSPITAL_COMMUNITY)
Admission: EM | Admit: 2017-12-07 | Discharge: 2017-12-07 | Disposition: A | Discharge status: Home / Self Care | Payer: 59 | Attending: Emergency Medicine | Admitting: Emergency Medicine

## 2017-12-07 DIAGNOSIS — M79605 Pain in left leg: Secondary | ICD-10-CM | POA: Diagnosis not present

## 2017-12-07 DIAGNOSIS — M7989 Other specified soft tissue disorders: Principal | ICD-10-CM

## 2017-12-07 DIAGNOSIS — Z7982 Long term (current) use of aspirin: Secondary | ICD-10-CM | POA: Diagnosis not present

## 2017-12-07 DIAGNOSIS — Z86718 Personal history of other venous thrombosis and embolism: Secondary | ICD-10-CM

## 2017-12-07 DIAGNOSIS — Z79899 Other long term (current) drug therapy: Secondary | ICD-10-CM | POA: Insufficient documentation

## 2017-12-07 DIAGNOSIS — I10 Essential (primary) hypertension: Secondary | ICD-10-CM | POA: Insufficient documentation

## 2017-12-07 NOTE — ED Triage Notes (Signed)
Pt reports hx of dvt/pe and having left calf pain x 1 week. Reports that her calf feels tight and pain is radiating up the back of her thigh. No resp distress is noted, did have recent pneumonia.

## 2017-12-07 NOTE — Discharge Instructions (Addendum)
Please read attached information. If you experience any new or worsening signs or symptoms please return to the emergency room for evaluation. Please follow-up with your primary care provider or specialist as discussed.  °

## 2017-12-07 NOTE — ED Notes (Signed)
Patient transported to vascular. 

## 2017-12-07 NOTE — Progress Notes (Addendum)
*  Preliminary Results* Left lower extremity venous duplex completed. Left lower extremity is negative for deep vein thrombosis. There is no evidence of Left Baker's cyst.  12/07/2017 1:34 PM  [name]

## 2017-12-07 NOTE — ED Notes (Signed)
Pt returned from vascular

## 2017-12-07 NOTE — ED Provider Notes (Signed)
[facility] EMERGENCY DEPARTMENT Provider Note   CSN: [id number] Arrival date & time: 12/07/17  1049     History   Chief Complaint Chief Complaint  Patient presents with  . Leg Pain    HPI [name] is a 47 y.o. female.  HPI   47 year old female presents today with complaints of left thigh pain.  Patient notes that approximately 1 week ago she developed pain in her left calf, this has traveled up to the left posterior thigh.  She notes no significant swelling or edema, she notes originally she had faint swelling in her left ankle, but none in the remainder of the lower extremity.,  She reports pain is worse with palpation.  She notes that recently she was diagnosed with pneumonia, was started on azithromycin, prednisone and cough medication.  She notes the symptoms have improved although she has very minimal shortness of breath secondary to that which she is improving.  She denies any chest pain, denies any recent surgeries, sedation.  She notes a history of DVT and PE in the past that was likely secondary to immobilization and surgery of her left foot.  She notes she was on Xarelto at that time and was taken off in the summer 2018.  She notes since that time she has had no recurring DVTs or PEs.  Past Medical History:  Diagnosis Date  . DVT (deep venous thrombosis) (HCC)   . Headache   . Hypertension   . PE (pulmonary embolism) 10/2015    Patient Active Problem List   Diagnosis Date Noted  . Cough 04/26/2016  . Acute upper respiratory infection 03/13/2016  . Pulmonary embolism (HCC)   . Acute saddle pulmonary embolism without acute cor pulmonale (HCC)   . Pleuritic chest pain   . Acute saddle pulmonary embolism with acute cor pulmonale (HCC) 10/24/2015  . Essential hypertension 10/24/2015  . Hypothyroidism, acquired 10/24/2015    Past Surgical History:  Procedure Laterality Date  . BUNIONECTOMY    . CARPAL TUNNEL RELEASE    . CESAREAN SECTION        OB History   None      Home Medications    Prior to Admission medications   Medication Sig Start Date End Date Taking? Authorizing Provider  ALPRAZolam (XANAX) 0.25 MG tablet Take 0.25 mg by mouth 2 (two) times daily as needed for anxiety.    [provider]  aspirin 325 MG EC tablet Take 325 mg by mouth daily.    [provider]  atenolol (TENORMIN) 50 MG tablet Take 50 mg by mouth daily.    [provider]  famotidine (PEPCID) 20 MG tablet One at bedtime 02/24/16   Nyoka Cowden, MD  levothyroxine (SYNTHROID, LEVOTHROID) 175 MCG tablet Take 175 mcg by mouth daily before breakfast.  08/15/15   [provider]  SUMAtriptan (IMITREX) 25 MG tablet Take 25 mg by mouth every 2 (two) hours as needed for migraine.  09/21/15   [provider]  traMADol (ULTRAM) 50 MG tablet Take 1 tablet (50 mg total) by mouth every 6 (six) hours as needed. 04/20/16   Fayrene Helper, PA-C    Family History Family History  Problem Relation Age of Onset  . Hypertension Father   . Hypothyroidism Father   . Stomach cancer Maternal Grandfather   . Pulmonary embolism Neg Hx     Social History Social History   Tobacco Use  . Smoking status: Never Smoker  . Smokeless  tobacco: Never Used  Substance Use Topics  . Alcohol use: Yes  . Drug use: No     Allergies   Sulfa antibiotics   Review of Systems Review of Systems  All other systems reviewed and are negative.  Physical Exam Updated Vital Signs BP 131/77   Pulse (!) 55   Temp 98.1 F (36.7 C) (Oral)   Resp 16   SpO2 99%   Physical Exam  Constitutional: She is oriented to person, place, and time. She appears well-developed and well-nourished.  HENT:  Head: Normocephalic and atraumatic.  Eyes: Pupils are equal, round, and reactive to light. Conjunctivae are normal. Right eye exhibits no discharge. Left eye exhibits no discharge. No scleral icterus.  Neck: Normal range of motion. No JVD  present. No tracheal deviation present.  Cardiovascular: Normal rate, regular rhythm, normal heart sounds and intact distal pulses.  Pulmonary/Chest: Effort normal and breath sounds normal. No stridor. No respiratory distress. She has no wheezes. She has no rales.  Musculoskeletal:  Left lower extremity without swelling or edema, minimal tenderness palpation of left posterior thigh  Neurological: She is alert and oriented to person, place, and time. Coordination normal.  Psychiatric: She has a normal mood and affect. Her behavior is normal. Judgment and thought content normal.  Nursing note and vitals reviewed.    ED Treatments / Results  Labs (all labs ordered are listed, but only abnormal results are displayed) Labs Reviewed - No data to display  EKG None  Radiology Vas [place] Lower Extremity Venous (dvt) (only [facility])  Result Date: 12/07/2017  Lower Venous Study Indications: Hx of dvt.  Performing Technologist: Blanch Media  Examination Guidelines: A complete evaluation includes B-mode imaging, spectral Doppler, color Doppler, and power Doppler as needed of all accessible portions of each vessel. Bilateral testing is considered an integral part of a complete examination. Limited examinations for reoccurring indications may be performed as noted.  Right Venous Findings: +---+---------------+---------+-----------+----------+-------+    CompressibilityPhasicitySpontaneityPropertiesSummary +---+---------------+---------+-----------+----------+-------+ CFVFull           Yes      Yes                          +---+---------------+---------+-----------+----------+-------+  Left Venous Findings: +---------+---------------+---------+-----------+----------+-------+          CompressibilityPhasicitySpontaneityPropertiesSummary +---------+---------------+---------+-----------+----------+-------+ CFV      Full           Yes      Yes                           +---------+---------------+---------+-----------+----------+-------+ SFJ      Full                                                 +---------+---------------+---------+-----------+----------+-------+ FV Prox  Full                                                 +---------+---------------+---------+-----------+----------+-------+ FV Mid   Full                                                 +---------+---------------+---------+-----------+----------+-------+  FV DistalFull                                                 +---------+---------------+---------+-----------+----------+-------+ PFV      Full                                                 +---------+---------------+---------+-----------+----------+-------+ POP      Full           Yes      Yes                          +---------+---------------+---------+-----------+----------+-------+ PTV      Full                                                 +---------+---------------+---------+-----------+----------+-------+ PERO     Full                                                 +---------+---------------+---------+-----------+----------+-------+    Summary: Right: No evidence of common femoral vein obstruction. Left: There is no evidence of deep vein thrombosis in the lower extremity. No cystic structure found in the popliteal fossa.  *See table(s) above for measurements and observations. Electronically signed by Coral Else MD on 12/07/2017 at 5:38:59 PM.    Final     Procedures Procedures (including critical care time)  Medications Ordered in ED Medications - No data to display   Initial Impression / Assessment and Plan / ED Course  I have reviewed the triage vital signs and the nursing notes.  Pertinent labs & imaging results that were available during my care of the patient were reviewed by me and considered in my medical decision making (see chart for details).     Labs:   Imaging:  DVT lower extremity  Consults:  Therapeutics:  Discharge Meds:   Assessment/Plan: 30 YOF presents today with complaints of leg pain.  This is likely muscular in nature, she has no DVTs on study.  Patient has recent gnosis of pneumonia, I have very low suspicion for any acute pulmonary embolism.  Patient well-appearing in no acute distress discharged with outpatient follow-up and strict return precautions.  Verbalized understanding and agreement to today's plan.      Final Clinical Impressions(s) / ED Diagnoses   Final diagnoses:  Left leg pain    ED Discharge Orders    None       Rosalio Loud 12/07/17 1824    Tegeler, Canary Brim, MD 12/07/17 2312

## 2020-09-12 ENCOUNTER — Ambulatory Visit
Admission: RE | Admit: 2020-09-12 | Discharge: 2020-09-12 | Disposition: A | Discharge status: Home / Self Care | Payer: 59 | Source: Ambulatory Visit | Attending: Internal Medicine | Admitting: Internal Medicine

## 2020-09-12 ENCOUNTER — Other Ambulatory Visit: Payer: Self-pay | Admitting: Internal Medicine

## 2020-09-12 ENCOUNTER — Other Ambulatory Visit: Payer: Self-pay

## 2020-09-12 DIAGNOSIS — R0789 Other chest pain: Secondary | ICD-10-CM

## 2020-09-12 DIAGNOSIS — R0600 Dyspnea, unspecified: Secondary | ICD-10-CM

## 2020-09-12 MED ORDER — IOPAMIDOL (ISOVUE-370) INJECTION 76%
75.0000 mL | Freq: Once | INTRAVENOUS | Status: AC | PRN
  Administered 2020-09-12: 75 mL via INTRAVENOUS

## 2021-11-25 ENCOUNTER — Emergency Department (HOSPITAL_BASED_OUTPATIENT_CLINIC_OR_DEPARTMENT_OTHER)
Admission: EM | Admit: 2021-11-25 | Discharge: 2021-11-26 | Disposition: A | Discharge status: Home / Self Care | Payer: 59 | Attending: Emergency Medicine | Admitting: Emergency Medicine

## 2021-11-25 ENCOUNTER — Emergency Department (HOSPITAL_BASED_OUTPATIENT_CLINIC_OR_DEPARTMENT_OTHER): Payer: 59

## 2021-11-25 ENCOUNTER — Encounter (HOSPITAL_BASED_OUTPATIENT_CLINIC_OR_DEPARTMENT_OTHER): Payer: Self-pay | Admitting: Emergency Medicine

## 2021-11-25 ENCOUNTER — Other Ambulatory Visit: Payer: Self-pay

## 2021-11-25 DIAGNOSIS — Z8616 Personal history of COVID-19: Secondary | ICD-10-CM | POA: Insufficient documentation

## 2021-11-25 DIAGNOSIS — Z7982 Long term (current) use of aspirin: Secondary | ICD-10-CM | POA: Insufficient documentation

## 2021-11-25 DIAGNOSIS — R059 Cough, unspecified: Secondary | ICD-10-CM | POA: Insufficient documentation

## 2021-11-25 DIAGNOSIS — R0789 Other chest pain: Secondary | ICD-10-CM | POA: Diagnosis present

## 2021-11-25 DIAGNOSIS — D72829 Elevated white blood cell count, unspecified: Secondary | ICD-10-CM | POA: Insufficient documentation

## 2021-11-25 LAB — CBC
HCT: 42.9 % (ref 36.0–46.0)
Hemoglobin: 14.9 g/dL (ref 12.0–15.0)
MCH: 30.1 pg (ref 26.0–34.0)
MCHC: 34.7 g/dL (ref 30.0–36.0)
MCV: 86.7 fL (ref 80.0–100.0)
Platelets: 350 10*3/uL (ref 150–400)
RBC: 4.95 MIL/uL (ref 3.87–5.11)
RDW: 11.8 % (ref 11.5–15.5)
WBC: 11.3 10*3/uL — ABNORMAL HIGH (ref 4.0–10.5)
nRBC: 0 % (ref 0.0–0.2)

## 2021-11-25 LAB — BASIC METABOLIC PANEL
Anion gap: 8 (ref 5–15)
BUN: 22 mg/dL — ABNORMAL HIGH (ref 6–20)
CO2: 26 mmol/L (ref 22–32)
Calcium: 9.2 mg/dL (ref 8.9–10.3)
Chloride: 102 mmol/L (ref 98–111)
Creatinine, Ser: 0.82 mg/dL (ref 0.44–1.00)
GFR, Estimated: >60 mL/min (ref >60)
Glucose, Bld: 103 mg/dL — ABNORMAL HIGH (ref 70–99)
Potassium: 4 mmol/L (ref 3.5–5.1)
Sodium: 136 mmol/L (ref 135–145)

## 2021-11-25 LAB — TROPONIN I (HIGH SENSITIVITY): Troponin I (High Sensitivity): 2 ng/L (ref <18)

## 2021-11-25 NOTE — ED Triage Notes (Signed)
Pt reports she tested + for covid on 10/6. Seen by UC and prescribed steroids. No relief of sx. Dry cough persists associated with chest tightness. Denies shob, fevers.   Sent here by UC for CTA to r/o PE.

## 2021-11-26 LAB — D-DIMER, QUANTITATIVE: D-Dimer, Quant: 0.31 ug/mL-FEU (ref 0.00–0.50)

## 2021-11-26 LAB — TROPONIN I (HIGH SENSITIVITY): Troponin I (High Sensitivity): 2 ng/L (ref <18)

## 2021-11-26 MED ORDER — BENZONATATE 100 MG PO CAPS: 100.0000 mg | ORAL_CAPSULE | Freq: Three times a day (TID) | ORAL | 0 refills | Status: AC

## 2021-11-26 MED ORDER — FLUTICASONE PROPIONATE HFA 44 MCG/ACT IN AERO: 2.0000 | INHALATION_SPRAY | Freq: Every day | RESPIRATORY_TRACT | 12 refills | Status: AC

## 2021-11-26 NOTE — Discharge Instructions (Addendum)
You were evaluated in the Emergency Department and after careful evaluation, we did not find any emergent condition requiring admission or further testing in the hospital.  Overall your work-up was reassuring.  As discussed, given your negative D-dimer, have low suspicion that you have a PE at this time.  Please take the steroid inhaler as directed.  Use Tessalon Perles for cough.  Please make sure to follow-up with your primary care doctor.  Please return to the Emergency Department if you experience any worsening of your condition. Thank you for allowing [place] to be a part of your care.

## 2021-11-26 NOTE — ED Provider Notes (Signed)
[facility]PARTMENT Provider Note   CSN: [id number] Arrival date & time: 11/25/21  1845     History  Chief Complaint  Patient presents with   Chest Pain   Cough    [name] is a 51 y.o. female.  HPI 51 year old female with a history of hypertension, DVT, PE and headache presents to the ER with complaints of cough and chest tightness.  Patient was diagnosed with COVID on 10/6.  She was seen at [facility] and was given a course of steroids and promethazine for cough.  She continues to have a dry nonproductive cough and some chest tightness.  No shortness of breath or fevers.  She went back to urgent care to get more medications today and they sent her here to the ER to rule out a PE.  She states that compared to her prior PE this does feel different.  She has no chest pain.  She is not on anticoagulation.  She denies any history of asthma or COPD.  No smoking history.    Home Medications Prior to Admission medications   Medication Sig Start Date End Date Taking? Authorizing Provider  benzonatate (TESSALON) 100 MG capsule Take 1 capsule (100 mg total) by mouth every 8 (eight) hours. 11/26/21  Yes Sharyn Lull A, PA-C  fluticasone (FLOVENT HFA) 44 MCG/ACT inhaler Inhale 2 puffs into the lungs daily. 11/26/21  Yes Garald Balding, PA-C  ALPRAZolam (XANAX) 0.25 MG tablet Take 0.25 mg by mouth 2 (two) times daily as needed for anxiety.    [provider]  aspirin 325 MG EC tablet Take 325 mg by mouth daily.    [provider]  atenolol (TENORMIN) 50 MG tablet Take 50 mg by mouth daily.    [provider]  famotidine (PEPCID) 20 MG tablet One at bedtime 02/24/16   Tanda Rockers, MD  levothyroxine (SYNTHROID, LEVOTHROID) 175 MCG tablet Take 175 mcg by mouth daily before breakfast.  08/15/15   [provider]  SUMAtriptan (IMITREX) 25 MG tablet Take 25 mg by mouth every 2 (two) hours as needed for migraine.  09/21/15   [provider]  traMADol (ULTRAM) 50 MG tablet Take 1 tablet (50 mg total) by mouth every 6 (six) hours as needed. 04/20/16   Domenic Moras, PA-C      Allergies    Sulfa antibiotics    Review of Systems   Review of Systems Ten systems reviewed and are negative for acute change, except as noted in the HPI.   Physical Exam Updated Vital Signs BP 116/82   Pulse 82   Temp 98.6 F (37 C) (Oral)   Resp 19   Ht 5\' 3"  (1.6 m)   Wt 64.9 kg   SpO2 99%   BMI 25.33 kg/m  Physical Exam Vitals and nursing note reviewed.  Constitutional:      General: She is not in acute distress.    Appearance: She is well-developed.  HENT:     Head: Normocephalic and atraumatic.  Eyes:     Conjunctiva/sclera: Conjunctivae normal.  Cardiovascular:     Rate and Rhythm: Normal rate and regular rhythm.     Heart sounds: No murmur heard.    No S3 sounds.  Pulmonary:     Effort: Pulmonary effort is normal. No respiratory distress.     Breath sounds: Normal breath sounds. No decreased breath sounds, wheezing, rhonchi or rales.     Comments: Intermittent dry cough  Abdominal:  Palpations: Abdomen is soft.     Tenderness: There is no abdominal tenderness.  Musculoskeletal:        General: No swelling.     Cervical back: Neck supple.     Right lower leg: No edema.     Left lower leg: No edema.  Skin:    General: Skin is warm and dry.     Capillary Refill: Capillary refill takes less than 2 seconds.  Neurological:     Mental Status: She is alert.  Psychiatric:        Mood and Affect: Mood normal.     ED Results / Procedures / Treatments   Labs (all labs ordered are listed, but only abnormal results are displayed) Labs Reviewed  BASIC METABOLIC PANEL - Abnormal; Notable for the following components:      Result Value   Glucose, Bld 103 (*)    BUN 22 (*)    All other components within normal limits  CBC - Abnormal; Notable for the following components:   WBC 11.3 (*)    All other  components within normal limits  D-DIMER, QUANTITATIVE  TROPONIN I (HIGH SENSITIVITY)  TROPONIN I (HIGH SENSITIVITY)    EKG None  Radiology DG Chest 2 View  Result Date: 11/25/2021 CLINICAL DATA:  Chest pain.  COVID-19 positive. EXAM: CHEST - 2 VIEW COMPARISON:  03/02/2016 FINDINGS: Midline trachea. Normal heart size and mediastinal contours. No pleural effusion or pneumothorax. Biapical pleural thickening. Clear lungs. An EKG lead artifact left upper lobe on the frontal. IMPRESSION: No acute cardiopulmonary disease. Electronically Signed   By: Jeronimo Greaves M.D.   On: 11/25/2021 20:24    Procedures Procedures    Medications Ordered in ED Medications - No data to display  ED Course/ Medical Decision Making/ A&P                           Medical Decision Making Amount and/or Complexity of Data Reviewed Labs: ordered. Radiology: ordered.  51 year old female who presents to the ER with concerns for rule out PE given recent COVID diagnosis and dry cough with chest tightness.  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  Overall, presentation she is well-appearing.  Her vitals are reassuring.  She is not tachycardic, tachypneic or hypoxic.  She does have a dry cough intermittently as she speaks.  DDx includes PE, pneumonia, prolonged cough from COVID-19  Labs ordered, reviewed by me CBC with a leukocytosis of 11.3, question infectious versus recent steroid use.  BMP unremarkable.  Delta troponin negative.  D-dimer is negative.  Chest x-ray viewed, agree with radiology read, without evidence of pneumonia.  EKG nonischemic.  I had a shared decision-making conversation with the patient.  Given her history of PE would be not unreasonable to scan her as per discussion with Dr. Judd Lien, however her D-dimer is negative, and her vitals are overall reassuring and I have lower suspicion for PE.  Patient agrees that given negative D-dimer she does not want to forego with further CT imaging.   I have low suspicion for ACS.  Will prescribe her a steroid inhaler like Flovent, and give Tessalon Perles for cough.  I recommended that she follow-up with her PCP as she may need further evaluation if this continues to be an issue.  We discussed return precautions.  She voiced understanding and is agreeable.  Stable for discharge. Final Clinical Impression(s) / ED Diagnoses Final diagnoses:  Chest tightness  Rx / DC Orders ED Discharge Orders          Ordered    fluticasone (FLOVENT HFA) 44 MCG/ACT inhaler  Daily        11/26/21 0218    benzonatate (TESSALON) 100 MG capsule  Every 8 hours        11/26/21 0218              Mare Ferrari, PA-C 11/26/21 9924    Geoffery Lyons, MD 11/26/21 [phone]
